# Patient Record
Sex: Female | Born: 1940
Health system: Southern US, Community
[De-identification: ages and names within clinical notes are randomized; demographics above are authoritative.]

## PROBLEM LIST (undated history)

## (undated) DIAGNOSIS — I10 Essential (primary) hypertension: Secondary | ICD-10-CM

## (undated) HISTORY — PX: TOTAL SHOULDER REPLACEMENT: SUR1217

## (undated) HISTORY — PX: VARICOSE VEIN SURGERY: SHX832

## (undated) HISTORY — PX: KNEE SURGERY: SHX244

## (undated) HISTORY — DX: Essential (primary) hypertension: I10

## (undated) HISTORY — PX: TUBAL LIGATION: SHX77

---

## 2003-12-16 ENCOUNTER — Emergency Department: Payer: Self-pay | Admitting: General Practice

## 2004-01-04 ENCOUNTER — Ambulatory Visit: Payer: Self-pay

## 2005-06-30 ENCOUNTER — Ambulatory Visit: Payer: Self-pay | Admitting: Family Medicine

## 2006-11-04 ENCOUNTER — Ambulatory Visit: Payer: Self-pay | Admitting: Family Medicine

## 2007-08-28 ENCOUNTER — Ambulatory Visit: Payer: Self-pay | Admitting: Family Medicine

## 2008-01-31 ENCOUNTER — Ambulatory Visit: Payer: Self-pay | Admitting: Family Medicine

## 2008-09-23 ENCOUNTER — Ambulatory Visit: Payer: Self-pay | Admitting: Internal Medicine

## 2009-01-07 LAB — HM DEXA SCAN: HM DEXA SCAN: NORMAL

## 2009-09-03 ENCOUNTER — Ambulatory Visit: Payer: Self-pay | Admitting: Family Medicine

## 2010-09-17 ENCOUNTER — Ambulatory Visit: Payer: Self-pay | Admitting: Internal Medicine

## 2010-09-18 LAB — HM MAMMOGRAPHY: HM Mammogram: NORMAL

## 2011-04-01 ENCOUNTER — Ambulatory Visit: Payer: Self-pay | Admitting: Internal Medicine

## 2011-10-14 ENCOUNTER — Ambulatory Visit: Payer: Self-pay | Admitting: Internal Medicine

## 2011-11-20 ENCOUNTER — Ambulatory Visit: Payer: Self-pay | Admitting: Family Medicine

## 2011-11-20 LAB — URINALYSIS, COMPLETE
Ketone: NEGATIVE
Nitrite: NEGATIVE
Ph: 6 (ref 4.5–8.0)
Protein: NEGATIVE
Specific Gravity: 1.005 (ref 1.003–1.030)

## 2011-11-20 LAB — CBC WITH DIFFERENTIAL/PLATELET
Basophil #: 0.1 10*3/uL (ref 0.0–0.1)
Basophil %: 0.6 %
Eosinophil %: 0.6 %
Lymphocyte #: 1.8 10*3/uL (ref 1.0–3.6)
Lymphocyte %: 14.9 %
MCH: 32.7 pg (ref 26.0–34.0)
MCV: 97 fL (ref 80–100)
Monocyte %: 7.3 %
Neutrophil #: 9.1 10*3/uL — ABNORMAL HIGH (ref 1.4–6.5)
RBC: 4.65 10*6/uL (ref 3.80–5.20)
RDW: 12.7 % (ref 11.5–14.5)
WBC: 11.8 10*3/uL — ABNORMAL HIGH (ref 3.6–11.0)

## 2011-11-20 LAB — BASIC METABOLIC PANEL
Anion Gap: 8 (ref 7–16)
BUN: 18 mg/dL (ref 7–18)
Calcium, Total: 9.2 mg/dL (ref 8.5–10.1)
Co2: 29 mmol/L (ref 21–32)
EGFR (African American): >60
EGFR (Non-African Amer.): 56 — ABNORMAL LOW
Glucose: 102 mg/dL — ABNORMAL HIGH (ref 65–99)
Osmolality: 281 (ref 275–301)
Sodium: 140 mmol/L (ref 136–145)

## 2011-11-20 LAB — RAPID INFLUENZA A&B ANTIGENS

## 2013-11-21 LAB — CBC AND DIFFERENTIAL: Hemoglobin: 16.6 g/dL — AB (ref 12.0–16.0)

## 2013-11-21 LAB — BASIC METABOLIC PANEL
BUN: 14 mg/dL (ref 4–21)
Creatinine: 0.8 mg/dL (ref 0.5–1.1)

## 2013-11-21 LAB — LIPID PANEL
Cholesterol: 186 mg/dL (ref 0–200)
HDL: 62 mg/dL (ref 35–70)
LDL Cholesterol: 92 mg/dL
Triglycerides: 162 mg/dL — AB (ref 40–160)

## 2013-11-21 LAB — TSH: TSH: 1.3 u[IU]/mL (ref 0.41–5.90)

## 2013-12-14 ENCOUNTER — Ambulatory Visit: Payer: Self-pay | Admitting: Internal Medicine

## 2014-08-29 DIAGNOSIS — Z0000 Encounter for general adult medical examination without abnormal findings: Secondary | ICD-10-CM | POA: Diagnosis not present

## 2014-12-01 ENCOUNTER — Encounter: Payer: Self-pay | Admitting: Internal Medicine

## 2014-12-01 ENCOUNTER — Other Ambulatory Visit: Payer: Self-pay | Admitting: Internal Medicine

## 2014-12-01 DIAGNOSIS — N3941 Urge incontinence: Secondary | ICD-10-CM | POA: Insufficient documentation

## 2014-12-01 DIAGNOSIS — I6930 Unspecified sequelae of cerebral infarction: Secondary | ICD-10-CM | POA: Insufficient documentation

## 2014-12-01 DIAGNOSIS — I10 Essential (primary) hypertension: Secondary | ICD-10-CM | POA: Insufficient documentation

## 2014-12-01 DIAGNOSIS — K219 Gastro-esophageal reflux disease without esophagitis: Secondary | ICD-10-CM | POA: Insufficient documentation

## 2014-12-01 DIAGNOSIS — Z8673 Personal history of transient ischemic attack (TIA), and cerebral infarction without residual deficits: Secondary | ICD-10-CM | POA: Insufficient documentation

## 2014-12-01 DIAGNOSIS — M19049 Primary osteoarthritis, unspecified hand: Secondary | ICD-10-CM | POA: Insufficient documentation

## 2014-12-01 DIAGNOSIS — E781 Pure hyperglyceridemia: Secondary | ICD-10-CM | POA: Insufficient documentation

## 2014-12-20 ENCOUNTER — Other Ambulatory Visit: Payer: Self-pay | Admitting: Internal Medicine

## 2014-12-21 NOTE — Telephone Encounter (Signed)
pts coming in on  2/28 for physical

## 2015-01-07 HISTORY — PX: INTRACAPSULAR CATARACT EXTRACTION: SHX361

## 2015-03-06 ENCOUNTER — Ambulatory Visit (INDEPENDENT_AMBULATORY_CARE_PROVIDER_SITE_OTHER): Payer: Medicare Other | Admitting: Internal Medicine

## 2015-03-06 ENCOUNTER — Encounter: Payer: Self-pay | Admitting: Internal Medicine

## 2015-03-06 VITALS — BP 140/84 | HR 84 | Ht 60.0 in | Wt 202.4 lb

## 2015-03-06 DIAGNOSIS — Z1211 Encounter for screening for malignant neoplasm of colon: Secondary | ICD-10-CM | POA: Diagnosis not present

## 2015-03-06 DIAGNOSIS — Z0000 Encounter for general adult medical examination without abnormal findings: Secondary | ICD-10-CM | POA: Diagnosis not present

## 2015-03-06 DIAGNOSIS — Z1231 Encounter for screening mammogram for malignant neoplasm of breast: Secondary | ICD-10-CM | POA: Diagnosis not present

## 2015-03-06 DIAGNOSIS — I10 Essential (primary) hypertension: Secondary | ICD-10-CM

## 2015-03-06 DIAGNOSIS — E781 Pure hyperglyceridemia: Secondary | ICD-10-CM

## 2015-03-06 DIAGNOSIS — I6930 Unspecified sequelae of cerebral infarction: Secondary | ICD-10-CM | POA: Diagnosis not present

## 2015-03-06 LAB — POCT URINALYSIS DIPSTICK
Bilirubin, UA: NEGATIVE
Glucose, UA: NEGATIVE
Ketones, UA: NEGATIVE
Leukocytes, UA: NEGATIVE
NITRITE UA: NEGATIVE
PH UA: 6.5
PROTEIN UA: NEGATIVE
RBC UA: NEGATIVE
Spec Grav, UA: 1.01
UROBILINOGEN UA: 0.2

## 2015-03-06 MED ORDER — METOPROLOL SUCCINATE ER 50 MG PO TB24: 50.0000 mg | ORAL_TABLET | Freq: Every day | ORAL | Status: DC

## 2015-03-06 NOTE — Progress Notes (Signed)
Patient: Maria Gibson, Female    DOB: [birth year], 75 y.o.   MRN: [id number] Visit Date: 03/06/2015  Today's Provider: Bari Edward, MD   Chief Complaint  Patient presents with  . Medicare Wellness   Subjective:    Annual wellness visit Maria Gibson is a 75 y.o. female who presents today for her Subsequent Annual Wellness Visit. She feels fairly well. She reports exercising rarely. She reports she is sleeping fairly well. She is due for a mammogram.   She recently had [facility] screening and all was normal except for BMI.  ----------------------------------------------------------- Hypertension This is a chronic problem. The current episode started more than 1 year ago. The problem is unchanged. The problem is controlled. Pertinent negatives include no chest pain, headaches, palpitations or shortness of breath.    Review of Systems  Constitutional: Negative for fever, chills and fatigue.  HENT: Negative for congestion, hearing loss, tinnitus, trouble swallowing and voice change.   Eyes: Negative for visual disturbance.  Respiratory: Negative for cough, chest tightness, shortness of breath and wheezing.   Cardiovascular: Negative for chest pain, palpitations and leg swelling.  Gastrointestinal: Negative for vomiting, abdominal pain, diarrhea and constipation.  Endocrine: Negative for polydipsia and polyuria.  Genitourinary: Negative for dysuria, frequency, vaginal bleeding, vaginal discharge and genital sores.  Musculoskeletal: Positive for arthralgias (right shoulder). Negative for joint swelling and gait problem.  Skin: Negative for color change and rash.  Neurological: Negative for dizziness, tremors, syncope, light-headedness and headaches.  Hematological: Negative for adenopathy. Does not bruise/bleed easily.  Psychiatric/Behavioral: Negative for confusion, sleep disturbance and dysphoric mood. The patient is not nervous/anxious.     Social History   Social  History  . Marital Status: Widowed    Spouse Name: N/A  . Number of Children: N/A  . Years of Education: N/A   Occupational History  . Not on file.   Social History Main Topics  . Smoking status: Never Smoker   . Smokeless tobacco: Not on file  . Alcohol Use: 0.0 oz/week    0 Standard drinks or equivalent per week     Comment: occasional  . Drug Use: No  . Sexual Activity: Not on file   Other Topics Concern  . Not on file   Social History Narrative    Patient Active Problem List   Diagnosis Date Noted  . Hx of completed stroke 12/01/2014  . Essential (primary) hypertension 12/01/2014  . Acid reflux 12/01/2014  . Hypertriglyceridemia 12/01/2014  . Degenerative arthritis of finger 12/01/2014  . Urge incontinence of urine 12/01/2014  . Sequelae of cerebral infarction 12/01/2014    Past Surgical History  Procedure Laterality Date  . Varicose vein surgery    . Tubal ligation    . Total shoulder replacement    . Knee surgery      Her family history includes Diabetes in her father; Heart failure in her father and mother.    Previous Medications   ASPIRIN 325 MG TABLET    Take 1 tablet by mouth daily.   CALCIUM CARB-CHOLECALCIFEROL (CALCIUM + D3) 600-200 MG-UNIT TABS    Take by mouth.   MULTIPLE VITAMINS PO    Take by mouth.   NUTRITIONAL SUPPLEMENTS (ARTHRO-COMPLEX PO)    Take by mouth.   PREBIOTIC PRODUCT PO    Take by mouth.    Patient Care Team: Reubin Milan, MD as PCP - General (Family Medicine)     Objective:   Vitals: BP 140/84 mmHg  Pulse  84  Ht 5' (1.524 m)  Wt 202 lb 6.4 oz (91.808 kg)  BMI 39.53 kg/m2  Physical Exam  Constitutional: She is oriented to person, place, and time. She appears well-developed and well-nourished. No distress.  HENT:  Head: Normocephalic and atraumatic.  Right Ear: Tympanic membrane and ear canal normal.  Left Ear: Tympanic membrane and ear canal normal.  Nose: Right sinus exhibits no maxillary sinus tenderness.  Left sinus exhibits no maxillary sinus tenderness.  Mouth/Throat: Uvula is midline and oropharynx is clear and moist.  Eyes: Conjunctivae and EOM are normal. Right eye exhibits no discharge. Left eye exhibits no discharge. No scleral icterus.  Neck: Normal range of motion. Carotid bruit is not present. No erythema present. No thyromegaly present.  Cardiovascular: Normal rate, regular rhythm, normal heart sounds and normal pulses.   Pulmonary/Chest: Effort normal. No respiratory distress. She has no wheezes. Right breast exhibits no mass, no nipple discharge, no skin change and no tenderness. Left breast exhibits no mass, no nipple discharge, no skin change and no tenderness.  Abdominal: Soft. Bowel sounds are normal. There is no hepatosplenomegaly. There is no tenderness. There is no CVA tenderness.  Musculoskeletal: She exhibits edema. She exhibits no tenderness.  Lymphadenopathy:    She has no cervical adenopathy.    She has no axillary adenopathy.  Neurological: She is alert and oriented to person, place, and time. She has normal reflexes. No cranial nerve deficit or sensory deficit.  Skin: Skin is warm, dry and intact. No rash noted.  Psychiatric: She has a normal mood and affect. Her speech is normal and behavior is normal. Thought content normal.  Nursing note and vitals reviewed.   Activities of Daily Living In your present state of health, do you have any difficulty performing the following activities: 03/06/2015  Hearing? N  Vision? Y  Difficulty concentrating or making decisions? N  Walking or climbing stairs? Y  Dressing or bathing? N  Doing errands, shopping? N    Fall Risk Assessment Fall Risk  03/06/2015  Falls in the past year? No      Depression Screen PHQ 2/9 Scores 03/06/2015  PHQ - 2 Score 0    Cognitive Testing - 6-CIT   Correct? Score   What year is it? yes 0 Yes = 0    No = 4  What month is it? yes 0 Yes = 0    No = 3  Remember:     [place], [place]     What time is it? yes 0 Yes = 0    No = 3  Count backwards from 20 to 1 yes 0 Correct = 0    1 error = 2   More than 1 error = 4  Say the months of the year in reverse. yes 0 Correct = 0    1 error = 2   More than 1 error = 4  What address did I ask you to remember? yes 0 Correct = 0  1 error = 2    2 error = 4    3 error = 6    4 error = 8    All wrong = 10       TOTAL SCORE  0/28   Interpretation:  Normal  Normal (0-7) Abnormal (8-28)        Medicare Annual Wellness Visit Summary:  Reviewed patient's Family Medical History Reviewed and updated list of patient's medical providers Assessment of  cognitive impairment was done Assessed patient's functional ability Established a written schedule for health screening services Health Risk Assessent Completed and Reviewed  Exercise Activities and Dietary recommendations Goals    . Increase physical activity       Immunization History  Administered Date(s) Administered  . Influenza-Unspecified 10/07/2014  . Pneumococcal Conjugate-13 11/21/2013  . Pneumococcal Polysaccharide-23 01/07/2006  . Zoster 01/08/2007    Health Maintenance  Topic Date Due  . COLONOSCOPY  12/19/1989  . TETANUS/TDAP  03/05/2020 (Originally 12/20/1958)  . INFLUENZA VACCINE  08/07/2015  . DEXA SCAN  Addressed  . ZOSTAVAX  Completed  . PNA vac Low Risk Adult  Completed     Discussed health benefits of physical activity, and encouraged her to engage in regular exercise appropriate for her age and condition.    ------------------------------------------------------------------------------------------------------------   Assessment & Plan:  1. Medicare annual wellness visit, subsequent Measures satisfied - MM DIGITAL SCREENING BILATERAL; Future  2. Essential (primary) hypertension controlled - CBC with Differential/Platelet - Comprehensive metabolic panel - TSH - POCT urinalysis dipstick - metoprolol succinate (TOPROL-XL) 50 MG  24 hr tablet; Take 1 tablet (50 mg total) by mouth daily. Take with or immediately following a meal.  Dispense: 90 tablet; Refill: 3  3. Sequelae of cerebral infarction Stable with minimal residual  4. Hypertriglyceridemia Will advise on medication Continue low fat diet and weight loss - Lipid panel  5. Colon cancer screening Pt had colonoscopy about 10 years ago - does not want to repeat - Fecal occult blood, imunochemical  6. Encounter for screening mammogram for breast cancer Pt will schedule   Bari Edward, MD [facility]  03/06/2015

## 2015-03-06 NOTE — Patient Instructions (Addendum)
Health Maintenance  Topic Date Due  . TETANUS/TDAP  12/20/1958  . COLONOSCOPY  12/19/1989  . INFLUENZA VACCINE  08/07/2015  . DEXA SCAN  Addressed  . ZOSTAVAX  Completed  . PNA vac Low Risk Adult  Completed   DASH Eating Plan DASH stands for "Dietary Approaches to Stop Hypertension." The DASH eating plan is a healthy eating plan that has been shown to reduce high blood pressure (hypertension). Additional health benefits may include reducing the risk of type 2 diabetes mellitus, heart disease, and stroke. The DASH eating plan may also help with weight loss. WHAT DO I NEED TO KNOW ABOUT THE DASH EATING PLAN? For the DASH eating plan, you will follow these general guidelines:  Choose foods with a percent daily value for sodium of less than 5% (as listed on the food label).  Use salt-free seasonings or herbs instead of table salt or sea salt.  Check with your health care provider or pharmacist before using salt substitutes.  Eat lower-sodium products, often labeled as "lower sodium" or "no salt added."  Eat fresh foods.  Eat more vegetables, fruits, and low-fat dairy products.  Choose whole grains. Look for the word "whole" as the first word in the ingredient list.  Choose fish and skinless chicken or [place] more often than red meat. Limit fish, poultry, and meat to 6 oz (170 g) each day.  Limit sweets, desserts, sugars, and sugary drinks.  Choose heart-healthy fats.  Limit cheese to 1 oz (28 g) per day.  Eat more home-cooked food and less restaurant, buffet, and fast food.  Limit fried foods.  Cook foods using methods other than frying.  Limit canned vegetables. If you do use them, rinse them well to decrease the sodium.  When eating at a restaurant, ask that your food be prepared with less salt, or no salt if possible. WHAT FOODS CAN I EAT? Seek help from a dietitian for individual calorie needs. Grains Whole grain or whole wheat bread. Brown rice. Whole grain or whole  wheat pasta. Quinoa, bulgur, and whole grain cereals. Low-sodium cereals. Corn or whole wheat flour tortillas. Whole grain cornbread. Whole grain crackers. Low-sodium crackers. Vegetables Fresh or frozen vegetables (raw, steamed, roasted, or grilled). Low-sodium or reduced-sodium tomato and vegetable juices. Low-sodium or reduced-sodium tomato sauce and paste. Low-sodium or reduced-sodium canned vegetables.  Fruits All fresh, canned (in natural juice), or frozen fruits. Meat and Other Protein Products Ground beef (85% or leaner), grass-fed beef, or beef trimmed of fat. Skinless chicken or [place]. Ground chicken or [place]. Pork trimmed of fat. All fish and seafood. Eggs. Dried beans, peas, or lentils. Unsalted nuts and seeds. Unsalted canned beans. Dairy Low-fat dairy products, such as skim or 1% milk, 2% or reduced-fat cheeses, low-fat ricotta or cottage cheese, or plain low-fat yogurt. Low-sodium or reduced-sodium cheeses. Fats and Oils Tub margarines without trans fats. Light or reduced-fat mayonnaise and salad dressings (reduced sodium). Avocado. Safflower, olive, or canola oils. Natural peanut or almond butter. Other Unsalted popcorn and pretzels. The items listed above may not be a complete list of recommended foods or beverages. Contact your dietitian for more options. WHAT FOODS ARE NOT RECOMMENDED? Grains White bread. White pasta. White rice. Refined cornbread. Bagels and croissants. Crackers that contain trans fat. Vegetables Creamed or fried vegetables. Vegetables in a cheese sauce. Regular canned vegetables. Regular canned tomato sauce and paste. Regular tomato and vegetable juices. Fruits Dried fruits. Canned fruit in light or heavy syrup. Fruit juice. Meat and Other Protein  Products Fatty cuts of meat. Ribs, chicken wings, bacon, sausage, bologna, salami, chitterlings, fatback, hot dogs, bratwurst, and packaged luncheon meats. Salted nuts and seeds. Canned beans with  salt. Dairy Whole or 2% milk, cream, half-and-half, and cream cheese. Whole-fat or sweetened yogurt. Full-fat cheeses or blue cheese. Nondairy creamers and whipped toppings. Processed cheese, cheese spreads, or cheese curds. Condiments Onion and garlic salt, seasoned salt, table salt, and sea salt. Canned and packaged gravies. Worcestershire sauce. Tartar sauce. Barbecue sauce. Teriyaki sauce. Soy sauce, including reduced sodium. Steak sauce. Fish sauce. Oyster sauce. Cocktail sauce. Horseradish. Ketchup and mustard. Meat flavorings and tenderizers. Bouillon cubes. Hot sauce. Tabasco sauce. Marinades. Taco seasonings. Relishes. Fats and Oils Butter, stick margarine, lard, shortening, ghee, and bacon fat. Coconut, palm kernel, or palm oils. Regular salad dressings. Other Pickles and olives. Salted popcorn and pretzels. The items listed above may not be a complete list of foods and beverages to avoid. Contact your dietitian for more information. WHERE CAN I FIND MORE INFORMATION? National Heart, Lung, and Blood Institute: travelstabloid.com   This information is not intended to replace advice given to you by your health care provider. Make sure you discuss any questions you have with your health care provider.   Document Released: 12/12/2010 Document Revised: 01/13/2014 Document Reviewed: 10/27/2012 Elsevier Interactive Patient Education [employer].

## 2015-03-07 ENCOUNTER — Telehealth: Payer: Self-pay

## 2015-03-07 LAB — COMPREHENSIVE METABOLIC PANEL
ALK PHOS: 73 IU/L (ref 39–117)
ALT: 23 IU/L (ref 0–32)
AST: 21 IU/L (ref 0–40)
Albumin/Globulin Ratio: 1.7 (ref 1.1–2.5)
Albumin: 4.5 g/dL (ref 3.5–4.8)
BILIRUBIN TOTAL: 0.5 mg/dL (ref 0.0–1.2)
BUN / CREAT RATIO: 19 (ref 11–26)
BUN: 16 mg/dL (ref 8–27)
CHLORIDE: 102 mmol/L (ref 96–106)
CO2: 27 mmol/L (ref 18–29)
CREATININE: 0.84 mg/dL (ref 0.57–1.00)
Calcium: 9.1 mg/dL (ref 8.7–10.3)
GFR calc Af Amer: 79 mL/min/{1.73_m2} (ref >59)
GFR calc non Af Amer: 68 mL/min/{1.73_m2} (ref >59)
GLUCOSE: 97 mg/dL (ref 65–99)
Globulin, Total: 2.6 g/dL (ref 1.5–4.5)
Potassium: 4.2 mmol/L (ref 3.5–5.2)
Sodium: 143 mmol/L (ref 134–144)
Total Protein: 7.1 g/dL (ref 6.0–8.5)

## 2015-03-07 LAB — LIPID PANEL
CHOLESTEROL TOTAL: 194 mg/dL (ref 100–199)
Chol/HDL Ratio: 2.9 ratio units (ref 0.0–4.4)
HDL: 67 mg/dL (ref >39)
LDL CALC: 96 mg/dL (ref 0–99)
TRIGLYCERIDES: 156 mg/dL — AB (ref 0–149)
VLDL CHOLESTEROL CAL: 31 mg/dL (ref 5–40)

## 2015-03-07 LAB — CBC WITH DIFFERENTIAL/PLATELET
BASOS ABS: 0 10*3/uL (ref 0.0–0.2)
Basos: 0 %
EOS (ABSOLUTE): 0.3 10*3/uL (ref 0.0–0.4)
Eos: 4 %
HEMOGLOBIN: 16 g/dL — AB (ref 11.1–15.9)
Hematocrit: 47.5 % — ABNORMAL HIGH (ref 34.0–46.6)
Immature Grans (Abs): 0 10*3/uL (ref 0.0–0.1)
Immature Granulocytes: 0 %
LYMPHS ABS: 1.7 10*3/uL (ref 0.7–3.1)
Lymphs: 28 %
MCH: 32 pg (ref 26.6–33.0)
MCHC: 33.7 g/dL (ref 31.5–35.7)
MCV: 95 fL (ref 79–97)
MONOCYTES: 11 %
Monocytes Absolute: 0.7 10*3/uL (ref 0.1–0.9)
NEUTROS ABS: 3.4 10*3/uL (ref 1.4–7.0)
Neutrophils: 57 %
PLATELETS: 245 10*3/uL (ref 150–379)
RBC: 5 x10E6/uL (ref 3.77–5.28)
RDW: 13.2 % (ref 12.3–15.4)
WBC: 6 10*3/uL (ref 3.4–10.8)

## 2015-03-07 LAB — TSH: TSH: 2.73 u[IU]/mL (ref 0.450–4.500)

## 2015-03-07 NOTE — Telephone Encounter (Signed)
Spoke with patient. Patient advised of all results and verbalized understanding. Will call back with any future questions or concerns. MA[facility]  

## 2015-03-07 NOTE — Telephone Encounter (Signed)
-----   Message from Reubin Milan, MD sent at 03/07/2015  7:55 AM EST ----- Labs are all normal.

## 2015-03-08 ENCOUNTER — Ambulatory Visit
Admission: RE | Admit: 2015-03-08 | Discharge: 2015-03-08 | Disposition: A | Discharge status: Home / Self Care | Payer: Medicare Other | Source: Ambulatory Visit | Attending: Internal Medicine | Admitting: Internal Medicine

## 2015-03-08 DIAGNOSIS — Z1231 Encounter for screening mammogram for malignant neoplasm of breast: Secondary | ICD-10-CM | POA: Insufficient documentation

## 2015-03-08 DIAGNOSIS — Z0000 Encounter for general adult medical examination without abnormal findings: Secondary | ICD-10-CM

## 2015-03-14 ENCOUNTER — Telehealth: Payer: Self-pay

## 2015-03-14 LAB — FECAL OCCULT BLOOD, IMMUNOCHEMICAL: FECAL OCCULT BLD: NEGATIVE

## 2015-03-14 NOTE — Telephone Encounter (Signed)
-----   Message from Reubin MilanLaura H Berglund, MD sent at 03/14/2015  8:06 AM EST ----- Negative.

## 2015-03-14 NOTE — Telephone Encounter (Signed)
Spoke with patient. Patient advised of all results and verbalized understanding. Will call back with any future questions or concerns. MA[facility]  

## 2015-10-17 ENCOUNTER — Encounter: Payer: Self-pay | Admitting: Internal Medicine

## 2015-10-17 ENCOUNTER — Ambulatory Visit (INDEPENDENT_AMBULATORY_CARE_PROVIDER_SITE_OTHER): Payer: Medicare Other | Admitting: Internal Medicine

## 2015-10-17 VITALS — BP 124/82 | HR 69 | Resp 16 | Ht 60.0 in | Wt 210.0 lb

## 2015-10-17 DIAGNOSIS — I10 Essential (primary) hypertension: Secondary | ICD-10-CM | POA: Diagnosis not present

## 2015-10-17 DIAGNOSIS — I739 Peripheral vascular disease, unspecified: Secondary | ICD-10-CM | POA: Insufficient documentation

## 2015-10-17 DIAGNOSIS — I872 Venous insufficiency (chronic) (peripheral): Secondary | ICD-10-CM | POA: Diagnosis not present

## 2015-10-17 DIAGNOSIS — M19072 Primary osteoarthritis, left ankle and foot: Secondary | ICD-10-CM

## 2015-10-17 DIAGNOSIS — M19079 Primary osteoarthritis, unspecified ankle and foot: Secondary | ICD-10-CM | POA: Insufficient documentation

## 2015-10-17 NOTE — Patient Instructions (Signed)
DASH Eating Plan  DASH stands for "Dietary Approaches to Stop Hypertension." The DASH eating plan is a healthy eating plan that has been shown to reduce high blood pressure (hypertension). Additional health benefits may include reducing the risk of type 2 diabetes mellitus, heart disease, and stroke. The DASH eating plan may also help with weight loss.  WHAT DO I NEED TO KNOW ABOUT THE DASH EATING PLAN?  For the DASH eating plan, you will follow these general guidelines:  · Choose foods with a percent daily value for sodium of less than 5% (as listed on the food label).  · Use salt-free seasonings or herbs instead of table salt or sea salt.  · Check with your health care provider or pharmacist before using salt substitutes.  · Eat lower-sodium products, often labeled as "lower sodium" or "no salt added."  · Eat fresh foods.  · Eat more vegetables, fruits, and low-fat dairy products.  · Choose whole grains. Look for the word "whole" as the first word in the ingredient list.  · Choose fish and skinless chicken or turkey more often than red meat. Limit fish, poultry, and meat to 6 oz (170 g) each day.  · Limit sweets, desserts, sugars, and sugary drinks.  · Choose heart-healthy fats.  · Limit cheese to 1 oz (28 g) per day.  · Eat more home-cooked food and less restaurant, buffet, and fast food.  · Limit fried foods.  · Cook foods using methods other than frying.  · Limit canned vegetables. If you do use them, rinse them well to decrease the sodium.  · When eating at a restaurant, ask that your food be prepared with less salt, or no salt if possible.  WHAT FOODS CAN I EAT?  Seek help from a dietitian for individual calorie needs.  Grains  Whole grain or whole wheat bread. Brown rice. Whole grain or whole wheat pasta. Quinoa, bulgur, and whole grain cereals. Low-sodium cereals. Corn or whole wheat flour tortillas. Whole grain cornbread. Whole grain crackers. Low-sodium crackers.  Vegetables  Fresh or frozen vegetables  (raw, steamed, roasted, or grilled). Low-sodium or reduced-sodium tomato and vegetable juices. Low-sodium or reduced-sodium tomato sauce and paste. Low-sodium or reduced-sodium canned vegetables.   Fruits  All fresh, canned (in natural juice), or frozen fruits.  Meat and Other Protein Products  Ground beef (85% or leaner), grass-fed beef, or beef trimmed of fat. Skinless chicken or turkey. Ground chicken or turkey. Pork trimmed of fat. All fish and seafood. Eggs. Dried beans, peas, or lentils. Unsalted nuts and seeds. Unsalted canned beans.  Dairy  Low-fat dairy products, such as skim or 1% milk, 2% or reduced-fat cheeses, low-fat ricotta or cottage cheese, or plain low-fat yogurt. Low-sodium or reduced-sodium cheeses.  Fats and Oils  Tub margarines without trans fats. Light or reduced-fat mayonnaise and salad dressings (reduced sodium). Avocado. Safflower, olive, or canola oils. Natural peanut or almond butter.  Other  Unsalted popcorn and pretzels.  The items listed above may not be a complete list of recommended foods or beverages. Contact your dietitian for more options.  WHAT FOODS ARE NOT RECOMMENDED?  Grains  White bread. White pasta. White rice. Refined cornbread. Bagels and croissants. Crackers that contain trans fat.  Vegetables  Creamed or fried vegetables. Vegetables in a cheese sauce. Regular canned vegetables. Regular canned tomato sauce and paste. Regular tomato and vegetable juices.  Fruits  Dried fruits. Canned fruit in light or heavy syrup. Fruit juice.  Meat and Other Protein   Products  Fatty cuts of meat. Ribs, chicken wings, bacon, sausage, bologna, salami, chitterlings, fatback, hot dogs, bratwurst, and packaged luncheon meats. Salted nuts and seeds. Canned beans with salt.  Dairy  Whole or 2% milk, cream, half-and-half, and cream cheese. Whole-fat or sweetened yogurt. Full-fat cheeses or blue cheese. Nondairy creamers and whipped toppings. Processed cheese, cheese spreads, or cheese  curds.  Condiments  Onion and garlic salt, seasoned salt, table salt, and sea salt. Canned and packaged gravies. Worcestershire sauce. Tartar sauce. Barbecue sauce. Teriyaki sauce. Soy sauce, including reduced sodium. Steak sauce. Fish sauce. Oyster sauce. Cocktail sauce. Horseradish. Ketchup and mustard. Meat flavorings and tenderizers. Bouillon cubes. Hot sauce. Tabasco sauce. Marinades. Taco seasonings. Relishes.  Fats and Oils  Butter, stick margarine, lard, shortening, ghee, and bacon fat. Coconut, palm kernel, or palm oils. Regular salad dressings.  Other  Pickles and olives. Salted popcorn and pretzels.  The items listed above may not be a complete list of foods and beverages to avoid. Contact your dietitian for more information.  WHERE CAN I FIND MORE INFORMATION?  National Heart, Lung, and Blood Institute: www.nhlbi.nih.gov/health/health-topics/topics/dash/     This information is not intended to replace advice given to you by your health care provider. Make sure you discuss any questions you have with your health care provider.     Document Released: 12/12/2010 Document Revised: 01/13/2014 Document Reviewed: 10/27/2012  Elsevier Interactive Patient Education ©2016 Elsevier Inc.

## 2015-10-17 NOTE — Progress Notes (Signed)
Date:  10/17/2015   Name:  Maria FreezeCarol Sears Gibson   DOB:  [birth year]   MRN:  [id number]   Chief Complaint: PAD [facility](HH Nurse did screening and said L leg below avg R over avg PAD may be to blame according [facility] ) [facility]  Used a machine attached to her great toe to measure arterial flow.  She was told it was decreased on the left but normal on the right and was told this could cause the ankle pain and swelling. She has edema of both lower legs as well as left ankle pain.  Seen by Ortho - joint aspiration of left ankle negative for crystals.  She takes no medication for ankle pain. She has tried compression stocks for edema.  She is very sedentary.  She does try to limit her salt intake.   Review of Systems  Constitutional: Negative for chills, fatigue and fever.  Respiratory: Negative for cough, shortness of breath and wheezing.   Cardiovascular: Positive for leg swelling. Negative for chest pain and palpitations.  Gastrointestinal: Negative for abdominal pain.  Endocrine: Negative for polydipsia and polyuria.  Musculoskeletal: Positive for arthralgias (left ankle) and joint swelling. Negative for gait problem.  Skin: Negative for color change and wound.  Psychiatric/Behavioral: Negative for dysphoric mood. The patient is not nervous/anxious.     Patient Active Problem List   Diagnosis Date Noted  . Venous insufficiency of both lower extremities 10/17/2015  . OA (osteoarthritis) of ankle 10/17/2015  . Hx of completed stroke 12/01/2014  . Essential (primary) hypertension 12/01/2014  . Acid reflux 12/01/2014  . Hypertriglyceridemia 12/01/2014  . Degenerative arthritis of finger 12/01/2014  . Urge incontinence of urine 12/01/2014  . Sequelae of cerebral infarction 12/01/2014    Prior to Admission medications   Medication Sig Start Date End Date Taking? Authorizing Provider  aspirin 325 MG tablet Take 1 tablet by mouth daily.    Historical Provider, MD    Calcium Carb-Cholecalciferol (CALCIUM + D3) 600-200 MG-UNIT TABS Take by mouth.    Historical Provider, MD  metoprolol succinate (TOPROL-XL) 50 MG 24 hr tablet Take 1 tablet (50 mg total) by mouth daily. Take with or immediately following a meal. 03/06/15   Reubin MilanLaura H Reather Steller, MD  MULTIPLE VITAMINS PO Take by mouth.    Historical Provider, MD  Nutritional Supplements (ARTHRO-COMPLEX PO) Take by mouth.    Historical Provider, MD  PREBIOTIC PRODUCT PO Take by mouth.    Historical Provider, MD    Allergies  Allergen Reactions  . Codeine   . Morphine Sulfate   . Shellfish Allergy   . Sulfa Antibiotics Nausea And Vomiting    Past Surgical History:  Procedure Laterality Date  . KNEE SURGERY    . TOTAL SHOULDER REPLACEMENT    . TUBAL LIGATION    . VARICOSE VEIN SURGERY      Social History  Substance Use Topics  . Smoking status: Never Smoker  . Smokeless tobacco: Never Used  . Alcohol use 0.0 oz/week     Comment: occasional     Medication list has been reviewed and updated.   Physical Exam  Constitutional: She is oriented to person, place, and time. She appears well-developed. No distress.  HENT:  Head: Normocephalic and atraumatic.  Cardiovascular: Normal rate, regular rhythm and normal heart sounds.   Pulses:      Dorsalis pedis pulses are 2+ on the right side, and 1+ on the left side.  Posterior tibial pulses are 1+ on the right side, and 1+ on the left side.  Pulmonary/Chest: Effort normal and breath sounds normal. No respiratory distress.  Musculoskeletal: She exhibits edema (2+ edema to mid tibia bilaterally) and tenderness.       Left ankle: She exhibits decreased range of motion. Tenderness.  Neurological: She is alert and oriented to person, place, and time.  Skin: Skin is warm and dry. No rash noted.  Punctate dusky red lesions on left lower leg and dorsum of foot  Psychiatric: She has a normal mood and affect. Her behavior is normal. Thought content normal.   Nursing note and vitals reviewed.   BP 124/82   Pulse 69   Resp 16   Ht 5' (1.524 m)   Wt 210 lb (95.3 kg)   SpO2 96%   BMI 41.01 kg/m   Assessment and Plan: 1. Essential (primary) hypertension controlled  2. Venous insufficiency of both lower extremities Recommend walking, compression socks Sodium restriction No evidence of significant or sx PAD - will review records from Nurse home visit when received  3. Primary osteoarthritis of left ankle Take Tylenol 1-2 times per day as needed   Bari Edward, MD [facility]  10/17/2015

## 2016-02-14 ENCOUNTER — Other Ambulatory Visit: Payer: Self-pay | Admitting: Internal Medicine

## 2016-02-14 DIAGNOSIS — Z1231 Encounter for screening mammogram for malignant neoplasm of breast: Secondary | ICD-10-CM

## 2016-02-26 ENCOUNTER — Other Ambulatory Visit: Payer: Self-pay | Admitting: Family Medicine

## 2016-03-11 ENCOUNTER — Ambulatory Visit
Admission: RE | Admit: 2016-03-11 | Discharge: 2016-03-11 | Disposition: A | Discharge status: Home / Self Care | Payer: Medicare Other | Source: Ambulatory Visit | Attending: Internal Medicine | Admitting: Internal Medicine

## 2016-03-11 DIAGNOSIS — Z1231 Encounter for screening mammogram for malignant neoplasm of breast: Secondary | ICD-10-CM | POA: Diagnosis not present

## 2016-03-15 ENCOUNTER — Other Ambulatory Visit: Payer: Self-pay | Admitting: Internal Medicine

## 2016-03-15 DIAGNOSIS — I10 Essential (primary) hypertension: Secondary | ICD-10-CM

## 2016-03-18 NOTE — Telephone Encounter (Signed)
Number on file is disconnected unable to reach pt

## 2016-03-18 NOTE — Telephone Encounter (Signed)
Try calling her significant other - Farooq Al Mafrachi.

## 2016-03-19 NOTE — Telephone Encounter (Signed)
The point was just to call him and ask to speak to [place].  There is no HIPPA violation in that.

## 2016-03-19 NOTE — Telephone Encounter (Signed)
Patient hasn't given me permission to talk with her significant other, also not listed on her emergency contact.

## 2016-03-19 NOTE — Telephone Encounter (Signed)
Pt no longer lives with her significant, unable to get a hold of patient

## 2016-04-01 ENCOUNTER — Encounter: Payer: Self-pay | Admitting: Internal Medicine

## 2016-04-01 ENCOUNTER — Ambulatory Visit (INDEPENDENT_AMBULATORY_CARE_PROVIDER_SITE_OTHER): Payer: Medicare Other | Admitting: Internal Medicine

## 2016-04-01 VITALS — BP 132/82 | HR 82 | Ht 60.0 in | Wt 195.0 lb

## 2016-04-01 DIAGNOSIS — I10 Essential (primary) hypertension: Secondary | ICD-10-CM

## 2016-04-01 DIAGNOSIS — M19072 Primary osteoarthritis, left ankle and foot: Secondary | ICD-10-CM | POA: Diagnosis not present

## 2016-04-01 DIAGNOSIS — E781 Pure hyperglyceridemia: Secondary | ICD-10-CM | POA: Diagnosis not present

## 2016-04-01 MED ORDER — METOPROLOL SUCCINATE ER 50 MG PO TB24: 50.0000 mg | ORAL_TABLET | Freq: Every day | ORAL | 1 refills | Status: DC

## 2016-04-01 NOTE — Progress Notes (Signed)
Date:  04/01/2016   Name:  Maria FreezeCarol Sears Mantz   DOB:  07-Nov-1940   MRN:  [id number]   Chief Complaint: Hypertension Hypertension  This is a chronic problem. The problem is unchanged. The problem is controlled. Pertinent negatives include no chest pain, headaches, palpitations or shortness of breath. Past treatments include beta blockers. The current treatment provides significant improvement.   OA Ankle - seen by Ortho.  Uses a cane for ambulation.  Taking only tylenol as needed for pain.  She also takes an aspirin per day.     Review of Systems  Constitutional: Negative for appetite change, fatigue, fever and unexpected weight change.  HENT: Negative for tinnitus and trouble swallowing.   Eyes: Negative for visual disturbance.  Respiratory: Negative for cough, chest tightness and shortness of breath.   Cardiovascular: Negative for chest pain, palpitations and leg swelling.  Gastrointestinal: Negative for abdominal pain.  Endocrine: Negative for polydipsia and polyuria.  Genitourinary: Negative for dysuria and hematuria.  Musculoskeletal: Negative for arthralgias.  Neurological: Negative for tremors, numbness and headaches.  Psychiatric/Behavioral: Negative for dysphoric mood.    Patient Active Problem List   Diagnosis Date Noted  . Venous insufficiency of both lower extremities 10/17/2015  . OA (osteoarthritis) of ankle 10/17/2015  . Hx of completed stroke 12/01/2014  . Essential (primary) hypertension 12/01/2014  . Acid reflux 12/01/2014  . Hypertriglyceridemia 12/01/2014  . Degenerative arthritis of finger 12/01/2014  . Urge incontinence of urine 12/01/2014  . Sequelae of cerebral infarction 12/01/2014    Prior to Admission medications   Medication Sig Start Date End Date Taking? Authorizing Provider  aspirin 325 MG tablet Take 1 tablet by mouth daily.    Historical Provider, MD  Calcium Carb-Cholecalciferol (CALCIUM + D3) 600-200 MG-UNIT TABS Take by mouth.     Historical Provider, MD  metoprolol succinate (TOPROL-XL) 50 MG 24 hr tablet TAKE 1 TABLET BY MOUTH DAILY WITH OR IMMEDIATELY FOLLOWING A MEAL 03/16/16   Reubin MilanLaura H Doral Digangi, MD  Multiple Vitamins-Minerals (MULTIVITAMIN ADULTS 50+ PO) Take by mouth.    Historical Provider, MD    Allergies  Allergen Reactions  . Codeine   . Morphine Sulfate   . Shellfish Allergy   . Sulfa Antibiotics Nausea And Vomiting    Past Surgical History:  Procedure Laterality Date  . KNEE SURGERY    . TOTAL SHOULDER REPLACEMENT    . TUBAL LIGATION    . VARICOSE VEIN SURGERY      Social History  Substance Use Topics  . Smoking status: Never Smoker  . Smokeless tobacco: Never Used  . Alcohol use 0.0 oz/week     Comment: occasional     Medication list has been reviewed and updated.   Physical Exam  Constitutional: She is oriented to person, place, and time. She appears well-developed. No distress.  HENT:  Head: Normocephalic and atraumatic.  Neck: Carotid bruit is not present.  Cardiovascular: Normal rate, regular rhythm and normal heart sounds.   Pulmonary/Chest: Effort normal and breath sounds normal. No respiratory distress. She has no wheezes. She has no rhonchi.  Musculoskeletal:       Left knee: She exhibits decreased range of motion (post surgical changes).       Left ankle: Normal.  Neurological: She is alert and oriented to person, place, and time.  Skin: Skin is warm and dry. No rash noted.  Psychiatric: She has a normal mood and affect. Her behavior is normal. Thought content normal.  Nursing note and  vitals reviewed.   BP 132/82 (BP Location: Right Arm, Patient Position: Sitting, Cuff Size: Large)   Pulse 82   Ht 5' (1.524 m)   Wt 195 lb (88.5 kg)   SpO2 99%   BMI 38.08 kg/m   Assessment and Plan: 1. Essential (primary) hypertension controlled - CBC with Differential/Platelet - Comprehensive metabolic panel - metoprolol succinate (TOPROL-XL) 50 MG 24 hr tablet; Take 1 tablet  (50 mg total) by mouth daily. Take with or immediately following a meal.  Dispense: 90 tablet; Refill: 1  2. Primary osteoarthritis of left ankle Seeing Ortho next week to see if ankle and knee replacement status is the culprit  3. Hypertriglyceridemia Continue healthy diet Has lost 15 lbs on [employer] - TSH - Lipid panel   Meds ordered this encounter  Medications  . metoprolol succinate (TOPROL-XL) 50 MG 24 hr tablet    Sig: Take 1 tablet (50 mg total) by mouth daily. Take with or immediately following a meal.    Dispense:  90 tablet    Refill:  1    Bari Edward, MD [facility]  04/01/2016

## 2016-04-02 LAB — COMPREHENSIVE METABOLIC PANEL
ALBUMIN: 4.5 g/dL (ref 3.5–4.8)
ALK PHOS: 69 IU/L (ref 39–117)
ALT: 33 IU/L — ABNORMAL HIGH (ref 0–32)
AST: 24 IU/L (ref 0–40)
Albumin/Globulin Ratio: 1.6 (ref 1.2–2.2)
BUN/Creatinine Ratio: 18 (ref 12–28)
BUN: 17 mg/dL (ref 8–27)
Bilirubin Total: 0.6 mg/dL (ref 0.0–1.2)
CO2: 28 mmol/L (ref 18–29)
CREATININE: 0.97 mg/dL (ref 0.57–1.00)
Calcium: 9.4 mg/dL (ref 8.7–10.3)
Chloride: 98 mmol/L (ref 96–106)
GFR calc Af Amer: 66 mL/min/{1.73_m2} (ref >59)
GFR calc non Af Amer: 57 mL/min/{1.73_m2} — ABNORMAL LOW (ref >59)
GLUCOSE: 88 mg/dL (ref 65–99)
Globulin, Total: 2.9 g/dL (ref 1.5–4.5)
Potassium: 4.4 mmol/L (ref 3.5–5.2)
Sodium: 143 mmol/L (ref 134–144)
Total Protein: 7.4 g/dL (ref 6.0–8.5)

## 2016-04-02 LAB — CBC WITH DIFFERENTIAL/PLATELET
BASOS: 0 %
Basophils Absolute: 0 10*3/uL (ref 0.0–0.2)
EOS (ABSOLUTE): 0.2 10*3/uL (ref 0.0–0.4)
Eos: 3 %
HEMOGLOBIN: 16.7 g/dL — AB (ref 11.1–15.9)
Hematocrit: 49.6 % — ABNORMAL HIGH (ref 34.0–46.6)
IMMATURE GRANULOCYTES: 0 %
Immature Grans (Abs): 0 10*3/uL (ref 0.0–0.1)
LYMPHS ABS: 2.4 10*3/uL (ref 0.7–3.1)
Lymphs: 37 %
MCH: 32.5 pg (ref 26.6–33.0)
MCHC: 33.7 g/dL (ref 31.5–35.7)
MCV: 97 fL (ref 79–97)
MONOS ABS: 0.5 10*3/uL (ref 0.1–0.9)
Monocytes: 8 %
NEUTROS ABS: 3.3 10*3/uL (ref 1.4–7.0)
Neutrophils: 52 %
Platelets: 213 10*3/uL (ref 150–379)
RBC: 5.14 x10E6/uL (ref 3.77–5.28)
RDW: 13.4 % (ref 12.3–15.4)
WBC: 6.5 10*3/uL (ref 3.4–10.8)

## 2016-04-02 LAB — LIPID PANEL
CHOL/HDL RATIO: 2.7 ratio (ref 0.0–4.4)
Cholesterol, Total: 184 mg/dL (ref 100–199)
HDL: 68 mg/dL (ref >39)
LDL Calculated: 86 mg/dL (ref 0–99)
Triglycerides: 149 mg/dL (ref 0–149)
VLDL CHOLESTEROL CAL: 30 mg/dL (ref 5–40)

## 2016-04-02 LAB — TSH: TSH: 2.69 u[IU]/mL (ref 0.450–4.500)

## 2016-04-15 ENCOUNTER — Other Ambulatory Visit: Payer: Self-pay | Admitting: Internal Medicine

## 2016-04-15 ENCOUNTER — Encounter: Payer: Self-pay | Admitting: Internal Medicine

## 2016-04-15 ENCOUNTER — Ambulatory Visit (INDEPENDENT_AMBULATORY_CARE_PROVIDER_SITE_OTHER): Payer: Medicare Other | Admitting: Internal Medicine

## 2016-04-15 VITALS — BP 112/74 | HR 76 | Ht 60.0 in | Wt 191.8 lb

## 2016-04-15 DIAGNOSIS — H02402 Unspecified ptosis of left eyelid: Secondary | ICD-10-CM | POA: Diagnosis not present

## 2016-04-15 DIAGNOSIS — K219 Gastro-esophageal reflux disease without esophagitis: Secondary | ICD-10-CM | POA: Diagnosis not present

## 2016-04-15 DIAGNOSIS — M1711 Unilateral primary osteoarthritis, right knee: Secondary | ICD-10-CM

## 2016-04-15 DIAGNOSIS — Z8673 Personal history of transient ischemic attack (TIA), and cerebral infarction without residual deficits: Secondary | ICD-10-CM

## 2016-04-15 DIAGNOSIS — I10 Essential (primary) hypertension: Secondary | ICD-10-CM | POA: Diagnosis not present

## 2016-04-15 NOTE — Progress Notes (Signed)
Date:  04/15/2016   Name:  Maria Gibson   DOB:  12/11/1940   MRN:  [id number]   Chief Complaint: GI Problem ([facility] states symptoms are subsiding since hospital visit. Tested gakll bladder and found out it was sludge. They don't want remove it unless pain comes back quite often. ) GI Problem  The primary symptoms include abdominal pain and nausea. Primary symptoms do not include fever, fatigue or vomiting. The illness began more than 7 days ago. Onset quality: since starting omeprazole. The problem has been rapidly improving.  The illness does not include chills.   Hx CVA - affecting right eye.  Has double vision since.  Now having some eye lid droop over left eye.  She is concerned that it is a sequelae of stroke.   Review of Systems  Constitutional: Negative for chills, fatigue and fever.  Eyes: Positive for visual disturbance (double vision).  Respiratory: Negative for chest tightness, shortness of breath and wheezing.   Cardiovascular: Negative for chest pain and palpitations.  Gastrointestinal: Positive for abdominal pain and nausea. Negative for blood in stool and vomiting.    Patient Active Problem List   Diagnosis Date Noted  . Venous insufficiency of both lower extremities 10/17/2015  . OA (osteoarthritis) of ankle 10/17/2015  . Hx of completed stroke 12/01/2014  . Essential (primary) hypertension 12/01/2014  . Acid reflux 12/01/2014  . Hypertriglyceridemia 12/01/2014  . Degenerative arthritis of finger 12/01/2014  . Urge incontinence of urine 12/01/2014  . Sequelae of cerebral infarction 12/01/2014    Prior to Admission medications   Medication Sig Start Date End Date Taking? Authorizing Provider  aspirin 325 MG tablet Take 1 tablet by mouth daily.   Yes Historical Provider, MD  Calcium Carb-Cholecalciferol (CALCIUM + D3) 600-200 MG-UNIT TABS Take by mouth.   Yes Historical Provider, MD  metoprolol succinate  (TOPROL-XL) 50 MG 24 hr tablet Take 1 tablet (50 mg total) by mouth daily. Take with or immediately following a meal. 04/01/16  Yes Reubin Milan, MD  Multiple Vitamins-Minerals (MULTIVITAMIN ADULTS 50+ PO) Take by mouth.   Yes Historical Provider, MD  omeprazole (PRILOSEC) 20 MG capsule Take 1 capsule by mouth daily. 04/13/16  Yes Historical Provider, MD    Allergies  Allergen Reactions  . Codeine   . Morphine Sulfate   . Shellfish Allergy   . Sulfa Antibiotics Nausea And Vomiting    Past Surgical History:  Procedure Laterality Date  . KNEE SURGERY    . TOTAL SHOULDER REPLACEMENT    . TUBAL LIGATION    . VARICOSE VEIN SURGERY      Social History  Substance Use Topics  . Smoking status: Never Smoker  . Smokeless tobacco: Never Used  . Alcohol use 0.0 oz/week     Comment: occasional     Medication list has been reviewed and updated.   Physical Exam  Constitutional: She is oriented to person, place, and time. She appears well-developed. No distress.  HENT:  Head: Normocephalic and atraumatic.  Eyes: Conjunctivae are normal. Right eye exhibits normal extraocular motion. Left eye exhibits abnormal extraocular motion (slightly lateral gaze noted).    Neck: Normal range of motion. No thyromegaly present.  Cardiovascular: Normal rate, regular rhythm and normal heart sounds.   Pulmonary/Chest: Effort normal and breath sounds normal. No respiratory distress. She has no wheezes.  Abdominal: Soft. Bowel sounds are normal. There is no tenderness.  Musculoskeletal: She exhibits no  edema.  Neurological: She is alert and oriented to person, place, and time.  Skin: Skin is warm and dry. No rash noted.  Psychiatric: She has a normal mood and affect. Her behavior is normal. Thought content normal.  Nursing note and vitals reviewed.   BP 112/74 (BP Location: Right Arm, Patient Position: Sitting, Cuff Size: Large)   Pulse 76   Ht 5' (1.524 m)   Wt 191 lb 12.8 oz (87 kg)   SpO2 98%    BMI 37.46 kg/m   Assessment and Plan: 1. Gastroesophageal reflux disease without esophagitis Continue daily PPI  2. Essential (primary) hypertension controlled  3. Arthritis of right knee Pursuing surgery  4. Hx of completed stroke With apparent left 3rd nerve palsy Eye lid droop appears to be due to redundant skin rather than recurrent stroke - consider surgical consultation  No orders of the defined types were placed in this encounter.   Bari Edward, MD [facility]  04/15/2016

## 2016-06-16 ENCOUNTER — Ambulatory Visit: Payer: Medicare Other

## 2016-06-16 ENCOUNTER — Encounter: Payer: Self-pay | Admitting: Internal Medicine

## 2016-06-16 ENCOUNTER — Telehealth: Payer: Self-pay | Admitting: Internal Medicine

## 2016-06-16 ENCOUNTER — Ambulatory Visit (INDEPENDENT_AMBULATORY_CARE_PROVIDER_SITE_OTHER): Payer: Medicare Other

## 2016-06-16 VITALS — BP 112/68 | HR 72 | Temp 98.0°F | Resp 16 | Ht 60.0 in | Wt 188.6 lb

## 2016-06-16 DIAGNOSIS — Z0000 Encounter for general adult medical examination without abnormal findings: Secondary | ICD-10-CM

## 2016-06-16 NOTE — Patient Instructions (Addendum)
Maria Gibson , Thank you for taking time to come for your Medicare Wellness Visit. I appreciate your ongoing commitment to your health goals. Please review the following plan we discussed and let me know if I can assist you in the future.   Screening recommendations/referrals: Colonoscopy: no longer required Mammogram: completed 03/31/2016, no longer required Bone Density: Due now Recommended yearly ophthalmology/optometry visit for glaucoma screening and checkup Recommended yearly dental visit for hygiene and checkup  Vaccinations: Influenza vaccine: up to date, due 09/2016  Pneumococcal vaccine: completed series Tdap vaccine: check with your insurance company for coverage  Shingles vaccine: shingrix- 04/01/2016    Advanced directives: Please bring a copy of your health care power of attorney and living will at your convenience.   Conditions/risks identified: Recommend drinking at least 3-4 glasses of water a day  Next appointment: Follow up in one year for your annual wellness exam.    Preventive Care 65 Years and Older, Female Preventive care refers to lifestyle choices and visits with your health care provider that can promote health and wellness. What does preventive care include?  A yearly physical exam. This is also called an annual well check.  Dental exams once or twice a year.  Routine eye exams. Ask your health care provider how often you should have your eyes checked.  Personal lifestyle choices, including:  Daily care of your teeth and gums.  Regular physical activity.  Eating a healthy diet.  Avoiding tobacco and drug use.  Limiting alcohol use.  Practicing safe sex.  Taking low-dose aspirin every day.  Taking vitamin and mineral supplements as recommended by your health care provider. What happens during an annual well check? The services and screenings done by your health care provider during your annual well check will depend on your age, overall  health, lifestyle risk factors, and family history of disease. Counseling  Your health care provider may ask you questions about your:  Alcohol use.  Tobacco use.  Drug use.  Emotional well-being.  Home and relationship well-being.  Sexual activity.  Eating habits.  History of falls.  Memory and ability to understand (cognition).  Work and work Astronomerenvironment.  Reproductive health. Screening  You may have the following tests or measurements:  Height, weight, and BMI.  Blood pressure.  Lipid and cholesterol levels. These may be checked every 5 years, or more frequently if you are over 77 years old.  Skin check.  Lung cancer screening. You may have this screening every year starting at age 77 if you have a 30-pack-year history of smoking and currently smoke or have quit within the past 15 years.  Fecal occult blood test (FOBT) of the stool. You may have this test every year starting at age 77.  Flexible sigmoidoscopy or colonoscopy. You may have a sigmoidoscopy every 5 years or a colonoscopy every 10 years starting at age 77.  Hepatitis C blood test.  Hepatitis B blood test.  Sexually transmitted disease (STD) testing.  Diabetes screening. This is done by checking your blood sugar (glucose) after you have not eaten for a while (fasting). You may have this done every 1-3 years.  Bone density scan. This is done to screen for osteoporosis. You may have this done starting at age 77.  Mammogram. This may be done every 1-2 years. Talk to your health care provider about how often you should have regular mammograms. Talk with your health care provider about your test results, treatment options, and if necessary, the need for  more tests. Vaccines  Your health care provider may recommend certain vaccines, such as:  Influenza vaccine. This is recommended every year.  Tetanus, diphtheria, and acellular pertussis (Tdap, Td) vaccine. You may need a Td booster every 10  years.  Zoster vaccine. You may need this after age 34.  Pneumococcal 13-valent conjugate (PCV13) vaccine. One dose is recommended after age 1.  Pneumococcal polysaccharide (PPSV23) vaccine. One dose is recommended after age 11. Talk to your health care provider about which screenings and vaccines you need and how often you need them. This information is not intended to replace advice given to you by your health care provider. Make sure you discuss any questions you have with your health care provider. Document Released: 01/19/2015 Document Revised: 09/12/2015 Document Reviewed: 10/24/2014 Elsevier Interactive Patient Education  2017 [employer] Prevention in the Home Falls can cause injuries. They can happen to people of all ages. There are many things you can do to make your home safe and to help prevent falls. What can I do on the outside of my home?  Regularly fix the edges of walkways and driveways and fix any cracks.  Remove anything that might make you trip as you walk through a door, such as a raised step or threshold.  Trim any bushes or trees on the path to your home.  Use bright outdoor lighting.  Clear any walking paths of anything that might make someone trip, such as rocks or tools.  Regularly check to see if handrails are loose or broken. Make sure that both sides of any steps have handrails.  Any raised decks and porches should have guardrails on the edges.  Have any leaves, snow, or ice cleared regularly.  Use sand or salt on walking paths during winter.  Clean up any spills in your garage right away. This includes oil or grease spills. What can I do in the bathroom?  Use night lights.  Install grab bars by the toilet and in the tub and shower. Do not use towel bars as grab bars.  Use non-skid mats or decals in the tub or shower.  If you need to sit down in the shower, use a plastic, non-slip stool.  Keep the floor dry. Clean up any water that  spills on the floor as soon as it happens.  Remove soap buildup in the tub or shower regularly.  Attach bath mats securely with double-sided non-slip rug tape.  Do not have throw rugs and other things on the floor that can make you trip. What can I do in the bedroom?  Use night lights.  Make sure that you have a light by your bed that is easy to reach.  Do not use any sheets or blankets that are too big for your bed. They should not hang down onto the floor.  Have a firm chair that has side arms. You can use this for support while you get dressed.  Do not have throw rugs and other things on the floor that can make you trip. What can I do in the kitchen?  Clean up any spills right away.  Avoid walking on wet floors.  Keep items that you use a lot in easy-to-reach places.  If you need to reach something above you, use a strong step stool that has a grab bar.  Keep electrical cords out of the way.  Do not use floor polish or wax that makes floors slippery. If you must use wax, use non-skid  floor wax.  Do not have throw rugs and other things on the floor that can make you trip. What can I do with my stairs?  Do not leave any items on the stairs.  Make sure that there are handrails on both sides of the stairs and use them. Fix handrails that are broken or loose. Make sure that handrails are as long as the stairways.  Check any carpeting to make sure that it is firmly attached to the stairs. Fix any carpet that is loose or worn.  Avoid having throw rugs at the top or bottom of the stairs. If you do have throw rugs, attach them to the floor with carpet tape.  Make sure that you have a light switch at the top of the stairs and the bottom of the stairs. If you do not have them, ask someone to add them for you. What else can I do to help prevent falls?  Wear shoes that:  Do not have high heels.  Have rubber bottoms.  Are comfortable and fit you well.  Are closed at the  toe. Do not wear sandals.  If you use a stepladder:  Make sure that it is fully opened. Do not climb a closed stepladder.  Make sure that both sides of the stepladder are locked into place.  Ask someone to hold it for you, if possible.  Clearly mark and make sure that you can see:  Any grab bars or handrails.  First and last steps.  Where the edge of each step is.  Use tools that help you move around (mobility aids) if they are needed. These include:  Canes.  Walkers.  Scooters.  Crutches.  Turn on the lights when you go into a dark area. Replace any light bulbs as soon as they burn out.  Set up your furniture so you have a clear path. Avoid moving your furniture around.  If any of your floors are uneven, fix them.  If there are any pets around you, be aware of where they are.  Review your medicines with your doctor. Some medicines can make you feel dizzy. This can increase your chance of falling. Ask your doctor what other things that you can do to help prevent falls. This information is not intended to replace advice given to you by your health care provider. Make sure you discuss any questions you have with your health care provider. Document Released: 10/19/2008 Document Revised: 05/31/2015 Document Reviewed: 01/27/2014 Elsevier Interactive Patient Education  2017 [employer].

## 2016-06-16 NOTE — Progress Notes (Signed)
Subjective:   Maria Gibson is a 76 y.o. female who presents for Medicare Annual (Subsequent) preventive examination.  Review of Systems:   Cardiac Risk Factors include: hypertension;obesity (BMI >30kg/m2);advanced age (>7055men, 13>65 women)     Objective:     Vitals: BP 112/68 (BP Location: Right Arm, Patient Position: Sitting)   Pulse 72   Temp 98 F (36.7 C)   Resp 16   Ht 5' (1.524 m)   Wt 188 lb 9.6 oz (85.5 kg)   BMI 36.83 kg/m   Body mass index is 36.83 kg/m.   Tobacco History  Smoking Status  . Never Smoker  Smokeless Tobacco  . Never Used     Counseling given: Not Answered   Past Medical History:  Diagnosis Date  . Hypertension    Past Surgical History:  Procedure Laterality Date  . INTRACAPSULAR CATARACT EXTRACTION Bilateral 2017  . KNEE SURGERY    . TOTAL SHOULDER REPLACEMENT Left   . TUBAL LIGATION    . VARICOSE VEIN SURGERY     Family History  Problem Relation Age of Onset  . Heart failure Mother   . Diabetes Father   . Heart failure Father   . Breast cancer Neg Hx    History  Sexual Activity  . Sexual activity: Not on file    Outpatient Encounter Prescriptions as of 06/16/2016  Medication Sig  . aspirin 325 MG tablet Take 1 tablet by mouth daily.  . Calcium Carb-Cholecalciferol (CALCIUM + D3) 600-200 MG-UNIT TABS Take by mouth.  . metoprolol succinate (TOPROL-XL) 50 MG 24 hr tablet Take 1 tablet (50 mg total) by mouth daily. Take with or immediately following a meal.  . Multiple Vitamins-Minerals (MULTIVITAMIN ADULTS 50+ PO) Take by mouth.  Marland Kitchen. omeprazole (PRILOSEC) 20 MG capsule Take 1 capsule by mouth daily.   No facility-administered encounter medications on file as of 06/16/2016.     Activities of Daily Living In your present state of health, do you have any difficulty performing the following activities: 06/16/2016 04/01/2016  Hearing? N N  Vision? N N  Difficulty concentrating or making decisions? N N  Walking or climbing  stairs? Y N  Dressing or bathing? N N  Doing errands, shopping? Y N  Preparing Food and eating ? N N  Using the Toilet? N N  In the past six months, have you accidently leaked urine? Y N  Do you have problems with loss of bowel control? N N  Managing your Medications? N N  Managing your Finances? N N  Housekeeping or managing your Housekeeping? N N  Some recent data might be hidden    Patient Care Team: Reubin MilanBerglund, Laura H, MD as PCP - General (Family Medicine) Zenon MayoMerz, Michael, MD as Referring Physician (Orthopedic Surgery)    Assessment:     Exercise Activities and Dietary recommendations Current Exercise Habits: Home exercise routine, Time (Minutes): 15, Frequency (Times/Week): 7, Weekly Exercise (Minutes/Week): 105, Intensity: Mild, Exercise limited by: orthopedic condition(s)  Goals    . Increase physical activity    . Increase water intake          Recommend drinking at least 3-4 glasses of water a day      Fall Risk Fall Risk  06/16/2016 04/01/2016 03/06/2015  Falls in the past year? Yes No No  Number falls in past yr: 1 - -  Injury with Fall? No - -  Risk for fall due to : Impaired balance/gait - -  Follow up Falls prevention  discussed - -   Depression Screen PHQ 2/9 Scores 06/16/2016 04/01/2016 03/06/2015  PHQ - 2 Score 0 0 0     Cognitive Function     6CIT Screen 06/16/2016 04/01/2016  What Year? 0 points 0 points  What month? 0 points 0 points  What time? 0 points 0 points  Count back from 20 0 points 0 points  Months in reverse 0 points 0 points  Repeat phrase 0 points 0 points  Total Score 0 0    Immunization History  Administered Date(s) Administered  . Influenza,inj,quad, With Preservative 09/07/2015  . Influenza-Unspecified 10/07/2014  . Pneumococcal Conjugate-13 11/21/2013  . Pneumococcal Polysaccharide-23 01/07/2006  . Pneumococcal-Unspecified 03/06/2015  . Zoster 01/08/2007  . Zoster Recombinat (Shingrix) 04/01/2016   Screening Tests Health  Maintenance  Topic Date Due  . TETANUS/TDAP  03/05/2020 (Originally 12/20/1959)  . INFLUENZA VACCINE  08/06/2016  . MAMMOGRAM  03/11/2017  . DEXA SCAN  Addressed  . PNA vac Low Risk Adult  Completed      Plan:    I have personally reviewed and addressed the Medicare Annual Wellness questionnaire and have noted the following in the patient's chart:  A. Medical and social history B. Use of alcohol, tobacco or illicit drugs  C. Current medications and supplements D. Functional ability and status E.  Nutritional status F.  Physical activity G. Advance directives H. List of other physicians I.  Hospitalizations, surgeries, and ER visits in previous 12 months J.  Vitals K. Screenings such as hearing and vision if needed, cognitive and depression L. Referrals and appointments   In addition, I have reviewed and discussed with patient certain preventive protocols, quality metrics, and best practice recommendations. A written personalized care plan for preventive services as well as general preventive health recommendations were provided to patient.   Signed,  Marin Roberts, LPN Nurse Health Advisor   MD Recommendations: none

## 2016-06-16 NOTE — Telephone Encounter (Signed)
Lm for pt to c/b direct to me at [phone] to discuss wheelchair ramp and help with chores after knee surgery. knb

## 2016-08-25 ENCOUNTER — Ambulatory Visit: Payer: Medicare Other | Admitting: Internal Medicine

## 2016-12-07 ENCOUNTER — Other Ambulatory Visit: Payer: Self-pay | Admitting: Internal Medicine

## 2016-12-07 DIAGNOSIS — I10 Essential (primary) hypertension: Secondary | ICD-10-CM

## 2017-02-04 ENCOUNTER — Other Ambulatory Visit: Payer: Self-pay | Admitting: Internal Medicine

## 2017-02-04 DIAGNOSIS — Z1231 Encounter for screening mammogram for malignant neoplasm of breast: Secondary | ICD-10-CM

## 2017-03-06 ENCOUNTER — Encounter: Payer: Self-pay | Admitting: Internal Medicine

## 2017-03-06 ENCOUNTER — Ambulatory Visit (INDEPENDENT_AMBULATORY_CARE_PROVIDER_SITE_OTHER): Payer: Medicare Other | Admitting: Internal Medicine

## 2017-03-06 ENCOUNTER — Ambulatory Visit
Admission: RE | Admit: 2017-03-06 | Discharge: 2017-03-06 | Disposition: A | Discharge status: Home / Self Care | Payer: Medicare Other | Source: Ambulatory Visit | Attending: Internal Medicine | Admitting: Internal Medicine

## 2017-03-06 VITALS — BP 128/80 | HR 78 | Ht 60.0 in | Wt 193.0 lb

## 2017-03-06 DIAGNOSIS — M25511 Pain in right shoulder: Secondary | ICD-10-CM

## 2017-03-06 DIAGNOSIS — Z1231 Encounter for screening mammogram for malignant neoplasm of breast: Secondary | ICD-10-CM

## 2017-03-06 DIAGNOSIS — Z0000 Encounter for general adult medical examination without abnormal findings: Secondary | ICD-10-CM

## 2017-03-06 DIAGNOSIS — I10 Essential (primary) hypertension: Secondary | ICD-10-CM

## 2017-03-06 DIAGNOSIS — E781 Pure hyperglyceridemia: Secondary | ICD-10-CM

## 2017-03-06 DIAGNOSIS — Z0001 Encounter for general adult medical examination with abnormal findings: Secondary | ICD-10-CM | POA: Diagnosis not present

## 2017-03-06 DIAGNOSIS — M19011 Primary osteoarthritis, right shoulder: Secondary | ICD-10-CM | POA: Insufficient documentation

## 2017-03-06 DIAGNOSIS — G8929 Other chronic pain: Secondary | ICD-10-CM

## 2017-03-06 DIAGNOSIS — K219 Gastro-esophageal reflux disease without esophagitis: Secondary | ICD-10-CM

## 2017-03-06 LAB — POCT URINALYSIS DIPSTICK
Bilirubin, UA: NEGATIVE
Blood, UA: NEGATIVE
GLUCOSE UA: NEGATIVE
Ketones, UA: NEGATIVE
LEUKOCYTES UA: NEGATIVE
NITRITE UA: NEGATIVE
PROTEIN UA: NEGATIVE
Urobilinogen, UA: 0.2 E.U./dL
pH, UA: 6.5 (ref 5.0–8.0)

## 2017-03-06 MED ORDER — METOPROLOL SUCCINATE ER 50 MG PO TB24: 50.0000 mg | ORAL_TABLET | Freq: Every day | ORAL | 3 refills | Status: DC

## 2017-03-06 MED ORDER — RANITIDINE HCL 300 MG PO TABS
300.0000 mg | ORAL_TABLET | Freq: Every day | ORAL | 5 refills | Status: DC
Start: 2017-03-06 — End: 2018-01-28

## 2017-03-06 MED ORDER — OMEPRAZOLE 20 MG PO CPDR: 20.0000 mg | DELAYED_RELEASE_CAPSULE | Freq: Every day | ORAL | 0 refills | Status: DC

## 2017-03-06 NOTE — Patient Instructions (Signed)
Take Tylenol 650 -1000 mg at bedtime for shoulder pain Use ice or heat during the day as needed

## 2017-03-06 NOTE — Progress Notes (Signed)
Date:  03/06/2017   Name:  Maria Gibson   DOB:  1940-07-01   MRN:  [id number]   Chief Complaint: Annual Exam (Breast Exam. ) and Shoulder Pain (Right shoulder. Was told years ago by shoulder surgeon that she hurt it badly. Still having pains and wants checked out. ) Maria Gibson is a 77 y.o. female who presents today for her Complete Annual Exam. She feels fairly well. She reports exercising none due to OA knees and shoulder. She reports she is sleeping fairly well unless her shoulder is hurting.   Hypertension  This is a chronic problem. The problem is controlled. Pertinent negatives include no chest pain, headaches, palpitations or shortness of breath. Past treatments include beta blockers. The current treatment provides significant improvement.  Gastroesophageal Reflux  She complains of heartburn. She reports no abdominal pain, no chest pain, no choking, no coughing, no sore throat or no wheezing. This is a recurrent problem. The problem occurs frequently. The problem has been gradually worsening. Pertinent negatives include no fatigue. She has tried a PPI (stopped PPI a while ago due to concerns over chronic treatment) for the symptoms.  Hyperlipidemia  Recent lipid tests were reviewed and are variable. Pertinent negatives include no chest pain or shortness of breath. Current antihyperlipidemic treatment includes diet change.  Shoulder Pain   The pain is present in the right shoulder. This is a chronic problem. The current episode started more than 1 year ago. The problem occurs daily. The problem has been gradually worsening. The quality of the pain is described as aching. The pain is moderate. Pertinent negatives include no fever. The symptoms are aggravated by lying down. She has tried nothing for the symptoms.   Lab Results  Component Value Date   CHOL 184 04/01/2016   HDL 68 04/01/2016   LDLCALC 86 04/01/2016   TRIG 149 04/01/2016   CHOLHDL 2.7 04/01/2016    Review  of Systems  Constitutional: Negative for chills, fatigue and fever.  HENT: Negative for congestion, hearing loss, sore throat, tinnitus, trouble swallowing and voice change.   Eyes: Negative for visual disturbance.  Respiratory: Negative for cough, choking, chest tightness, shortness of breath and wheezing.   Cardiovascular: Negative for chest pain, palpitations and leg swelling.  Gastrointestinal: Positive for heartburn. Negative for abdominal pain, constipation, diarrhea and vomiting.  Endocrine: Negative for polydipsia and polyuria.  Genitourinary: Negative for dysuria, frequency, genital sores, vaginal bleeding and vaginal discharge.  Musculoskeletal: Negative for arthralgias, gait problem and joint swelling.  Skin: Negative for color change and rash.  Neurological: Negative for dizziness, tremors, light-headedness and headaches.  Hematological: Negative for adenopathy. Does not bruise/bleed easily.  Psychiatric/Behavioral: Negative for dysphoric mood and sleep disturbance. The patient is not nervous/anxious.     Patient Active Problem List   Diagnosis Date Noted  . Arthritis of right knee 04/15/2016  . Blepharoptosis, acquired, left 04/15/2016  . Venous insufficiency of both lower extremities 10/17/2015  . OA (osteoarthritis) of ankle 10/17/2015  . Hx of completed stroke 12/01/2014  . Essential (primary) hypertension 12/01/2014  . GERD without esophagitis 12/01/2014  . Hypertriglyceridemia 12/01/2014  . Degenerative arthritis of finger 12/01/2014  . Urge incontinence of urine 12/01/2014  . Sequelae of cerebral infarction 12/01/2014    Prior to Admission medications   Medication Sig Start Date End Date Taking? Authorizing Provider  aspirin 325 MG tablet Take 1 tablet by mouth daily.    [provider]  Calcium Carb-Cholecalciferol (CALCIUM + D3) 600-200  MG-UNIT TABS Take by mouth.    [provider]  metoprolol succinate (TOPROL-XL) 50 MG 24 hr tablet TAKE 1  TABLET BY MOUTH DAILY( WITH OR IMMEDIATELY FOLLOWING A MEAL) 12/07/16   Reubin Milan, MD  Multiple Vitamins-Minerals (MULTIVITAMIN ADULTS 50+ PO) Take by mouth.    [provider]  omeprazole (PRILOSEC) 20 MG capsule Take 1 capsule by mouth daily. 04/13/16   [provider]    Allergies  Allergen Reactions  . Codeine   . Morphine Sulfate   . Shellfish Allergy   . Sulfa Antibiotics Nausea And Vomiting    Past Surgical History:  Procedure Laterality Date  . INTRACAPSULAR CATARACT EXTRACTION Bilateral 2017  . KNEE SURGERY    . TOTAL SHOULDER REPLACEMENT Left   . TUBAL LIGATION    . VARICOSE VEIN SURGERY      Social History   Tobacco Use  . Smoking status: Never Smoker  . Smokeless tobacco: Never Used  Substance Use Topics  . Alcohol use: Yes    Alcohol/week: 0.6 oz    Types: 1 Glasses of wine per week    Comment: occasional  . Drug use: No     Medication list has been reviewed and updated.  PHQ 2/9 Scores 06/16/2016 04/01/2016 03/06/2015  PHQ - 2 Score 0 0 0    Physical Exam  Constitutional: She is oriented to person, place, and time. She appears well-developed and well-nourished. No distress.  HENT:  Head: Normocephalic and atraumatic.  Right Ear: Tympanic membrane and ear canal normal.  Left Ear: Tympanic membrane and ear canal normal.  Nose: Right sinus exhibits no maxillary sinus tenderness. Left sinus exhibits no maxillary sinus tenderness.  Mouth/Throat: Uvula is midline and oropharynx is clear and moist.  Eyes: Conjunctivae and EOM are normal. Right eye exhibits no discharge. Left eye exhibits no discharge. No scleral icterus.  Neck: Normal range of motion. Carotid bruit is not present. No erythema present. No thyromegaly present.  Cardiovascular: Normal rate, regular rhythm, normal heart sounds and normal pulses.  Pulmonary/Chest: Effort normal. No respiratory distress. She has no wheezes. Right breast exhibits no mass, no nipple discharge,  no skin change and no tenderness. Left breast exhibits no mass, no nipple discharge, no skin change and no tenderness.  Abdominal: Soft. Bowel sounds are normal. There is no hepatosplenomegaly. There is no tenderness. There is no CVA tenderness.  Musculoskeletal: Normal range of motion.  Lymphadenopathy:    She has no cervical adenopathy.    She has no axillary adenopathy.  Neurological: She is alert and oriented to person, place, and time. She has normal reflexes. No cranial nerve deficit or sensory deficit.  Skin: Skin is warm, dry and intact. No rash noted.  Psychiatric: She has a normal mood and affect. Her speech is normal and behavior is normal. Thought content normal.  Nursing note and vitals reviewed.   BP 128/80   Pulse 78   Ht 5' (1.524 m)   Wt 183 lb (83 kg)   SpO2 98%   BMI 35.74 kg/m   Assessment and Plan: 1. Annual physical exam Mammogram scheduled MAW done last June  2. Essential (primary) hypertension controlled - CBC with Differential/Platelet - Comprehensive metabolic panel - TSH - POCT urinalysis dipstick - metoprolol succinate (TOPROL-XL) 50 MG 24 hr tablet; Take 1 tablet (50 mg total) by mouth daily. Take with or immediately following a meal.  Dispense: 90 tablet; Refill: 3  3. GERD without esophagitis Resume PPI for one month  then transition to daily H2 blocker - CBC with Differential/Platelet - omeprazole (PRILOSEC) 20 MG capsule; Take 1 capsule (20 mg total) by mouth daily.  Dispense: 30 capsule; Refill: 0 - ranitidine (ZANTAC) 300 MG tablet; Take 1 tablet (300 mg total) by mouth at bedtime.  Dispense: 30 tablet; Refill: 5  4. Hypertriglyceridemia Check labs Low fat diet - Lipid panel  5. Encounter for screening mammogram for breast cancer scheduled  6. Chronic right shoulder pain Likely OA Take tylenol at HS and use ice or heat during the day - DG Shoulder Right; Future   Meds ordered this encounter  Medications  . metoprolol succinate  (TOPROL-XL) 50 MG 24 hr tablet    Sig: Take 1 tablet (50 mg total) by mouth daily. Take with or immediately following a meal.    Dispense:  90 tablet    Refill:  3  . omeprazole (PRILOSEC) 20 MG capsule    Sig: Take 1 capsule (20 mg total) by mouth daily.    Dispense:  30 capsule    Refill:  0  . ranitidine (ZANTAC) 300 MG tablet    Sig: Take 1 tablet (300 mg total) by mouth at bedtime.    Dispense:  30 tablet    Refill:  5    Partially dictated using Animal nutritionistDragon software. Any errors are unintentional.  Bari EdwardLaura Maziah Keeling, MD [facility]  03/06/2017

## 2017-03-07 LAB — CBC WITH DIFFERENTIAL/PLATELET
BASOS: 0 %
Basophils Absolute: 0 10*3/uL (ref 0.0–0.2)
EOS (ABSOLUTE): 0.1 10*3/uL (ref 0.0–0.4)
EOS: 2 %
HEMATOCRIT: 46.1 % (ref 34.0–46.6)
Hemoglobin: 16 g/dL — ABNORMAL HIGH (ref 11.1–15.9)
IMMATURE GRANS (ABS): 0 10*3/uL (ref 0.0–0.1)
IMMATURE GRANULOCYTES: 0 %
LYMPHS: 38 %
Lymphocytes Absolute: 2.2 10*3/uL (ref 0.7–3.1)
MCH: 32.5 pg (ref 26.6–33.0)
MCHC: 34.7 g/dL (ref 31.5–35.7)
MCV: 94 fL (ref 79–97)
MONOCYTES: 9 %
Monocytes Absolute: 0.5 10*3/uL (ref 0.1–0.9)
Neutrophils Absolute: 3.1 10*3/uL (ref 1.4–7.0)
Neutrophils: 51 %
Platelets: 225 10*3/uL (ref 150–379)
RBC: 4.93 x10E6/uL (ref 3.77–5.28)
RDW: 13.4 % (ref 12.3–15.4)
WBC: 5.9 10*3/uL (ref 3.4–10.8)

## 2017-03-07 LAB — LIPID PANEL
CHOL/HDL RATIO: 2.4 ratio (ref 0.0–4.4)
Cholesterol, Total: 180 mg/dL (ref 100–199)
HDL: 75 mg/dL (ref >39)
LDL CALC: 78 mg/dL (ref 0–99)
Triglycerides: 134 mg/dL (ref 0–149)
VLDL Cholesterol Cal: 27 mg/dL (ref 5–40)

## 2017-03-07 LAB — COMPREHENSIVE METABOLIC PANEL
A/G RATIO: 1.6 (ref 1.2–2.2)
ALT: 24 IU/L (ref 0–32)
AST: 22 IU/L (ref 0–40)
Albumin: 4.4 g/dL (ref 3.5–4.8)
Alkaline Phosphatase: 72 IU/L (ref 39–117)
BUN/Creatinine Ratio: 19 (ref 12–28)
BUN: 16 mg/dL (ref 8–27)
Bilirubin Total: 0.4 mg/dL (ref 0.0–1.2)
CO2: 25 mmol/L (ref 20–29)
CREATININE: 0.84 mg/dL (ref 0.57–1.00)
Calcium: 9.2 mg/dL (ref 8.7–10.3)
Chloride: 103 mmol/L (ref 96–106)
GFR, EST AFRICAN AMERICAN: 78 mL/min/{1.73_m2} (ref >59)
GFR, EST NON AFRICAN AMERICAN: 67 mL/min/{1.73_m2} (ref >59)
GLOBULIN, TOTAL: 2.7 g/dL (ref 1.5–4.5)
Glucose: 85 mg/dL (ref 65–99)
POTASSIUM: 4.5 mmol/L (ref 3.5–5.2)
SODIUM: 144 mmol/L (ref 134–144)
TOTAL PROTEIN: 7.1 g/dL (ref 6.0–8.5)

## 2017-03-07 LAB — TSH: TSH: 3 u[IU]/mL (ref 0.450–4.500)

## 2017-03-09 ENCOUNTER — Encounter: Payer: Self-pay | Admitting: Internal Medicine

## 2017-03-12 ENCOUNTER — Ambulatory Visit: Payer: Medicare Other

## 2017-03-16 ENCOUNTER — Ambulatory Visit
Admission: RE | Admit: 2017-03-16 | Discharge: 2017-03-16 | Disposition: A | Discharge status: Home / Self Care | Payer: Medicare Other | Source: Ambulatory Visit | Attending: Internal Medicine | Admitting: Internal Medicine

## 2017-03-16 DIAGNOSIS — Z1231 Encounter for screening mammogram for malignant neoplasm of breast: Secondary | ICD-10-CM | POA: Diagnosis present

## 2017-06-24 ENCOUNTER — Ambulatory Visit (INDEPENDENT_AMBULATORY_CARE_PROVIDER_SITE_OTHER): Payer: Medicare Other

## 2017-06-24 VITALS — BP 138/80 | HR 73 | Temp 97.8°F | Resp 12 | Ht 60.0 in | Wt 211.4 lb

## 2017-06-24 DIAGNOSIS — Z0000 Encounter for general adult medical examination without abnormal findings: Secondary | ICD-10-CM | POA: Diagnosis not present

## 2017-06-24 NOTE — Progress Notes (Signed)
Subjective:   Maria Gibson is a 77 y.o. female who presents for Medicare Annual (Subsequent) preventive examination.  Review of Systems:  N/A Cardiac Risk Factors include: advanced age (>10men, >70 women);hypertension;obesity (BMI >30kg/m2);sedentary lifestyle     Objective:     Vitals: BP 138/80 (BP Location: Right Arm, Patient Position: Sitting, Cuff Size: Large)   Pulse 73   Temp 97.8 F (36.6 C) (Oral)   Resp 12   Ht 5' (1.524 m)   Wt 211 lb 6.4 oz (95.9 kg)   SpO2 92%   BMI 41.29 kg/m   Body mass index is 41.29 kg/m.  Advanced Directives 06/24/2017 06/16/2016 04/01/2016 03/06/2015  Does Patient Have a Medical Advance Directive? Yes Yes Yes No  Type of Estate agent of [place];Living will Healthcare Power of [place];Living will Healthcare Power of [place];Living will -  Copy of Healthcare Power of Attorney in Chart? No - copy requested No - copy requested No - copy requested -  Would patient like information on creating a medical advance directive? - - - No - patient declined information    Tobacco Social History   Tobacco Use  Smoking Status Never Smoker  Smokeless Tobacco Never Used  Tobacco Comment   smoking cessation materials not required     Counseling given: No Comment: smoking cessation materials not required  Clinical Intake:  Pre-visit preparation completed: Yes  Pain : No/denies pain   BMI - recorded: 35.74 Nutritional Status: BMI > 30  Obese Nutritional Risks: None Diabetes: No  How often do you need to have someone help you when you read instructions, pamphlets, or other written materials from your doctor or pharmacy?: 1 - Never  Interpreter Needed?: No  Information entered by :: AEversole, LPN  Past Medical History:  Diagnosis Date  . Hypertension    Past Surgical History:  Procedure Laterality Date  . INTRACAPSULAR CATARACT EXTRACTION Bilateral 2017  . KNEE SURGERY    . TOTAL SHOULDER REPLACEMENT Left    . TUBAL LIGATION    . VARICOSE VEIN SURGERY     Family History  Problem Relation Age of Onset  . Heart failure Mother   . Diabetes Father   . Heart failure Father   . Breast cancer Neg Hx    Social History   Socioeconomic History  . Marital status: Widowed    Spouse name: Not on file  . Number of children: 2  . Years of education: Not on file  . Highest education level: 12th grade  Occupational History  . Occupation: Retired  Engineer, production  . Financial resource strain: Not hard at all  . Food insecurity:    Worry: Never true    Inability: Never true  . Transportation needs:    Medical: No    Non-medical: No  Tobacco Use  . Smoking status: Never Smoker  . Smokeless tobacco: Never Used  . Tobacco comment: smoking cessation materials not required  Substance and Sexual Activity  . Alcohol use: Yes    Alcohol/week: 0.6 oz    Types: 1 Glasses of wine per week    Comment: occasional  . Drug use: No  . Sexual activity: Not Currently  Lifestyle  . Physical activity:    Days per week: 0 days    Minutes per session: 0 min  . Stress: Not at all  Relationships  . Social connections:    Talks on phone: Patient refused    Gets together: Patient refused    Attends  religious service: Patient refused    Active member of club or organization: Patient refused    Attends meetings of clubs or organizations: Patient refused    Relationship status: Widowed  Other Topics Concern  . Not on file  Social History Narrative  . Not on file    Outpatient Encounter Medications as of 06/24/2017  Medication Sig  . aspirin 325 MG tablet Take 1 tablet by mouth daily.  . Calcium Carb-Cholecalciferol (CALCIUM + D3) 600-200 MG-UNIT TABS Take by mouth.  . metoprolol succinate (TOPROL-XL) 50 MG 24 hr tablet Take 1 tablet (50 mg total) by mouth daily. Take with or immediately following a meal.  . Multiple Vitamins-Minerals (MULTIVITAMIN ADULTS 50+ PO) Take by mouth.  Marland Kitchen. omeprazole (PRILOSEC) 20  MG capsule Take 1 capsule (20 mg total) by mouth daily.  Marland Kitchen. OVER THE COUNTER MEDICATION Take 2 capsules by mouth daily.  Marland Kitchen. OVER THE COUNTER MEDICATION Take 3 tablets by mouth daily.  . ranitidine (ZANTAC) 300 MG tablet Take 1 tablet (300 mg total) by mouth at bedtime. (Patient not taking: Reported on 06/24/2017)   No facility-administered encounter medications on file as of 06/24/2017.     Activities of Daily Living In your present state of health, do you have any difficulty performing the following activities: 06/24/2017  Hearing? N  Comment denies hearing aids  Vision? N  Comment wears eyeglasses  Difficulty concentrating or making decisions? N  Walking or climbing stairs? Y  Comment joint pain  Dressing or bathing? N  Doing errands, shopping? N  Preparing Food and eating ? N  Comment denies dentures  Using the Toilet? N  In the past six months, have you accidently leaked urine? Y  Comment stress incontinence  Do you have problems with loss of bowel control? N  Managing your Medications? N  Managing your Finances? N  Housekeeping or managing your Housekeeping? N  Some recent data might be hidden    Patient Care Team: Reubin MilanBerglund, Laura H, MD as PCP - General (Family Medicine)    Assessment:   This is a routine wellness examination for Maria RegalCarol.  Exercise Activities and Dietary recommendations Current Exercise Habits: The patient does not participate in regular exercise at present, Exercise limited by: None identified  Goals    . DIET - INCREASE WATER INTAKE     Recommend to drink at least 6-8 8oz glasses of water per day.    . Increase physical activity       Fall Risk Fall Risk  06/24/2017 06/16/2016 04/01/2016 03/06/2015  Falls in the past year? No Yes No No  Number falls in past yr: - 1 - -  Injury with Fall? - No - -  Risk for fall due to : Impaired vision;Impaired balance/gait;Other (Comment) Impaired balance/gait - -  Risk for fall due to: Comment wears eyeglasses;  arthritis - - -  Follow up - Falls prevention discussed - -  Comment - got railings installed on front porch- will eval for ramp - -   FALL RISK PREVENTION PERTAINING TO HOME: Is your home free of loose throw rugs in walkways, pet beds, electrical cords, etc? Yes Is there adequate lighting in your home to reduce risk of falls?  Yes Are there stairs in or around your home WITH handrails? Yes  ASSISTIVE DEVICES UTILIZED TO PREVENT FALLS: Use of a cane, walker or w/c? No Grab bars in the bathroom? Yes  Shower chair or a place to sit while bathing? No An elevated toilet  seat or a handicapped toilet? No  Timed Get Up and Go Performed: Yes. Pt ambulated 10 feet within 22 sec. Gait slow, steady and without the use of an assistive device. No intervention required at this time. Fall risk prevention has been discussed.  Community Resource Referral:  Pt declined my offer to send [employer] Referral to Care Guide for a shower chair or an elevated toilet seat.  Depression Screen PHQ 2/9 Scores 06/24/2017 06/16/2016 04/01/2016 03/06/2015  PHQ - 2 Score 0 0 0 0  PHQ- 9 Score 0 - - -     Cognitive Function     6CIT Screen 06/24/2017 06/16/2016 04/01/2016  What Year? 0 points 0 points 0 points  What month? 0 points 0 points 0 points  What time? 0 points 0 points 0 points  Count back from 20 0 points 0 points 0 points  Months in reverse 0 points 0 points 0 points  Repeat phrase 0 points 0 points 0 points  Total Score 0 0 0    Immunization History  Administered Date(s) Administered  . Influenza,inj,quad, With Preservative 09/07/2015  . Influenza-Unspecified 10/07/2014, 09/06/2016  . Pneumococcal Conjugate-13 11/21/2013  . Pneumococcal Polysaccharide-23 01/07/2006  . Pneumococcal-Unspecified 03/06/2015  . Zoster 01/08/2007  . Zoster Recombinat (Shingrix) 04/01/2016    Qualifies for Shingles Vaccine? Yes. Zostavax completed 01/08/07. Due for final dose of Shingrix. First dose received  04/01/16. Advised to bring in a copy of her final vaccination record. Verbalized acceptance and understanding.  Due for Tdap vaccine. Education has been provided regarding the importance of this vaccine. Pt has been advised she may receive this vaccine at her local pharmacy or Health Dept. Also advised to provide a copy of her vaccination record if she chooses to receive this vaccine at her local pharmacy. Verbalized acceptance and understanding.  Screening Tests Health Maintenance  Topic Date Due  . TETANUS/TDAP  03/05/2020 (Originally 12/20/1959)  . INFLUENZA VACCINE  08/06/2017  . MAMMOGRAM  03/17/2018  . PNA vac Low Risk Adult  Completed  . DEXA SCAN  Addressed    Cancer Screenings: Lung: Low Dose CT Chest recommended if Age 31-80 years, 30 pack-year currently smoking OR have quit w/in 15years. Patient does not qualify. Breast:  Up to date on Mammogram? Yes. Completed 03/16/17. Repeat every year   Up to date of Bone Density/Dexa? Yes. Completed 01/07/09. Osteoporotic screenings no longer required. Colorectal: No longer required  Additional Screenings: Hepatitis C Screening: Does not qualify    Plan:  I have personally reviewed and addressed the Medicare Annual Wellness questionnaire and have noted the following in the patient's chart:  A. Medical and social history B. Use of alcohol, tobacco or illicit drugs  C. Current medications and supplements D. Functional ability and status E.  Nutritional status F.  Physical activity G. Advance directives H. List of other physicians I.  Hospitalizations, surgeries, and ER visits in previous 12 months J.  Vitals K. Screenings such as hearing and vision if needed, cognitive and depression L. Referrals and appointments  In addition, I have reviewed and discussed with patient certain preventive protocols, quality metrics, and best practice recommendations. A written personalized care plan for preventive services as well as general preventive  health recommendations were provided to patient.  Signed,  Deon Pilling, LPN Nurse Health Advisor  MD Recommendations: Zostavax completed 01/08/07. Due for final dose of Shingrix. First dose received 04/01/16. Advised to bring in a copy of her final vaccination record. Verbalized acceptance and understanding.  Due for Tdap vaccine. Education has been provided regarding the importance of this vaccine. Pt has been advised she may receive this vaccine at her local pharmacy or Health Dept. Also advised to provide a copy of her vaccination record if she chooses to receive this vaccine at her local pharmacy. Verbalized acceptance and understanding.

## 2017-06-24 NOTE — Patient Instructions (Addendum)
Maria Gibson , Thank you for taking time to come for your Medicare Wellness Visit. I appreciate your ongoing commitment to your health goals. Please review the following plan we discussed and let me know if I can assist you in the future.   Screening recommendations/referrals: Colorectal Screening: No longer required Mammogram: Up to date Bone Density: No longer required  Vision and Dental Exams: Recommended annual ophthalmology exams for early detection of glaucoma and other disorders of the eye Recommended annual dental exams for proper oral hygiene  Vaccinations: Influenza vaccine: Up to date Pneumococcal vaccine: Up to date Tdap vaccine: Please call your insurance company to determine your out of pocket expense. You may also receive this vaccine at your local pharmacy or Health Dept. Shingles vaccine: Please complete your final dose by 10/05/2016 then provide our office with a copy of your vaccination record    Advanced directives: Please bring a copy of your POA (Power of [place]remont[place]orney) and/or Living Will to your next appointment.  Goals: Recommend to drink at least 6-8 8oz glasses of water per day.  Next appointment: Please schedule your Annual Wellness Visit with your Nurse Health Advisor in one year.  Preventive Care 9165 Years and Older, Female Preventive care refers to lifestyle choices and visits with your health care provider that can promote health and wellness. What does preventive care include?  A yearly physical exam. This is also called an annual well check.  Dental exams once or twice a year.  Routine eye exams. Ask your health care provider how often you should have your eyes checked.  Personal lifestyle choices, including:  Daily care of your teeth and gums.  Regular physical activity.  Eating a healthy diet.  Avoiding tobacco and drug use.  Limiting alcohol use.  Practicing safe sex.  Taking low-dose aspirin every day.  Taking vitamin and mineral  supplements as recommended by your health care provider. What happens during an annual well check? The services and screenings done by your health care provider during your annual well check will depend on your age, overall health, lifestyle risk factors, and family history of disease. Counseling  Your health care provider may ask you questions about your:  Alcohol use.  Tobacco use.  Drug use.  Emotional well-being.  Home and relationship well-being.  Sexual activity.  Eating habits.  History of falls.  Memory and ability to understand (cognition).  Work and work Astronomerenvironment.  Reproductive health. Screening  You may have the following tests or measurements:  Height, weight, and BMI.  Blood pressure.  Lipid and cholesterol levels. These may be checked every 5 years, or more frequently if you are over 78 years old.  Skin check.  Lung cancer screening. You may have this screening every year starting at age 78 if you have a 30-pack-year history of smoking and currently smoke or have quit within the past 15 years.  Fecal occult blood test (FOBT) of the stool. You may have this test every year starting at age 78.  Flexible sigmoidoscopy or colonoscopy. You may have a sigmoidoscopy every 5 years or a colonoscopy every 10 years starting at age 78.  Hepatitis C blood test.  Hepatitis B blood test.  Sexually transmitted disease (STD) testing.  Diabetes screening. This is done by checking your blood sugar (glucose) after you have not eaten for a while (fasting). You may have this done every 1-3 years.  Bone density scan. This is done to screen for osteoporosis. You may have this done starting  at age 8.  Mammogram. This may be done every 1-2 years. Talk to your health care provider about how often you should have regular mammograms. Talk with your health care provider about your test results, treatment options, and if necessary, the need for more tests. Vaccines  Your  health care provider may recommend certain vaccines, such as:  Influenza vaccine. This is recommended every year.  Tetanus, diphtheria, and acellular pertussis (Tdap, Td) vaccine. You may need a Td booster every 10 years.  Zoster vaccine. You may need this after age 55.  Pneumococcal 13-valent conjugate (PCV13) vaccine. One dose is recommended after age 56.  Pneumococcal polysaccharide (PPSV23) vaccine. One dose is recommended after age 28. Talk to your health care provider about which screenings and vaccines you need and how often you need them. This information is not intended to replace advice given to you by your health care provider. Make sure you discuss any questions you have with your health care provider. Document Released: 01/19/2015 Document Revised: 09/12/2015 Document Reviewed: 10/24/2014 Elsevier Interactive Patient Education  2017 [place] Prevention in the Home Falls can cause injuries. They can happen to people of all ages. There are many things you can do to make your home safe and to help prevent falls. What can I do on the outside of my home?  Regularly fix the edges of walkways and driveways and fix any cracks.  Remove anything that might make you trip as you walk through a door, such as a raised step or threshold.  Trim any bushes or trees on the path to your home.  Use bright outdoor lighting.  Clear any walking paths of anything that might make someone trip, such as rocks or tools.  Regularly check to see if handrails are loose or broken. Make sure that both sides of any steps have handrails.  Any raised decks and porches should have guardrails on the edges.  Have any leaves, snow, or ice cleared regularly.  Use sand or salt on walking paths during winter.  Clean up any spills in your garage right away. This includes oil or grease spills. What can I do in the bathroom?  Use night lights.  Install grab bars by the toilet and in the tub and  shower. Do not use towel bars as grab bars.  Use non-skid mats or decals in the tub or shower.  If you need to sit down in the shower, use a plastic, non-slip stool.  Keep the floor dry. Clean up any water that spills on the floor as soon as it happens.  Remove soap buildup in the tub or shower regularly.  Attach bath mats securely with double-sided non-slip rug tape.  Do not have throw rugs and other things on the floor that can make you trip. What can I do in the bedroom?  Use night lights.  Make sure that you have a light by your bed that is easy to reach.  Do not use any sheets or blankets that are too big for your bed. They should not hang down onto the floor.  Have a firm chair that has side arms. You can use this for support while you get dressed.  Do not have throw rugs and other things on the floor that can make you trip. What can I do in the kitchen?  Clean up any spills right away.  Avoid walking on wet floors.  Keep items that you use a lot in easy-to-reach places.  If  you need to reach something above you, use a strong step stool that has a grab bar.  Keep electrical cords out of the way.  Do not use floor polish or wax that makes floors slippery. If you must use wax, use non-skid floor wax.  Do not have throw rugs and other things on the floor that can make you trip. What can I do with my stairs?  Do not leave any items on the stairs.  Make sure that there are handrails on both sides of the stairs and use them. Fix handrails that are broken or loose. Make sure that handrails are as long as the stairways.  Check any carpeting to make sure that it is firmly attached to the stairs. Fix any carpet that is loose or worn.  Avoid having throw rugs at the top or bottom of the stairs. If you do have throw rugs, attach them to the floor with carpet tape.  Make sure that you have a light switch at the top of the stairs and the bottom of the stairs. If you do not  have them, ask someone to add them for you. What else can I do to help prevent falls?  Wear shoes that:  Do not have high heels.  Have rubber bottoms.  Are comfortable and fit you well.  Are closed at the toe. Do not wear sandals.  If you use a stepladder:  Make sure that it is fully opened. Do not climb a closed stepladder.  Make sure that both sides of the stepladder are locked into place.  Ask someone to hold it for you, if possible.  Clearly mark and make sure that you can see:  Any grab bars or handrails.  First and last steps.  Where the edge of each step is.  Use tools that help you move around (mobility aids) if they are needed. These include:  Canes.  Walkers.  Scooters.  Crutches.  Turn on the lights when you go into a dark area. Replace any light bulbs as soon as they burn out.  Set up your furniture so you have a clear path. Avoid moving your furniture around.  If any of your floors are uneven, fix them.  If there are any pets around you, be aware of where they are.  Review your medicines with your doctor. Some medicines can make you feel dizzy. This can increase your chance of falling. Ask your doctor what other things that you can do to help prevent falls. This information is not intended to replace advice given to you by your health care provider. Make sure you discuss any questions you have with your health care provider. Document Released: 10/19/2008 Document Revised: 05/31/2015 Document Reviewed: 01/27/2014 Elsevier Interactive Patient Education  2017 [employer].

## 2018-01-28 ENCOUNTER — Ambulatory Visit: Payer: Medicare Other | Admitting: Internal Medicine

## 2018-01-28 ENCOUNTER — Encounter: Payer: Self-pay | Admitting: Internal Medicine

## 2018-01-28 VITALS — BP 114/80 | HR 68 | Temp 97.7°F | Ht 60.0 in | Wt 208.0 lb

## 2018-01-28 DIAGNOSIS — J40 Bronchitis, not specified as acute or chronic: Secondary | ICD-10-CM

## 2018-01-28 DIAGNOSIS — M19011 Primary osteoarthritis, right shoulder: Secondary | ICD-10-CM

## 2018-01-28 DIAGNOSIS — E2839 Other primary ovarian failure: Secondary | ICD-10-CM

## 2018-01-28 DIAGNOSIS — Z1231 Encounter for screening mammogram for malignant neoplasm of breast: Secondary | ICD-10-CM

## 2018-01-28 MED ORDER — BENZONATATE 100 MG PO CAPS: 100.0000 mg | ORAL_CAPSULE | Freq: Three times a day (TID) | ORAL | 0 refills | Status: DC

## 2018-01-28 MED ORDER — GUAIFENESIN-CODEINE 100-10 MG/5ML PO SYRP: 5.0000 mL | ORAL_SOLUTION | Freq: Three times a day (TID) | ORAL | 0 refills | Status: DC | PRN

## 2018-01-28 MED ORDER — DOXYCYCLINE HYCLATE 100 MG PO TABS: 100.0000 mg | ORAL_TABLET | Freq: Two times a day (BID) | ORAL | 0 refills | Status: AC

## 2018-01-28 NOTE — Patient Instructions (Signed)
Claritin or Allegra for post nasal drainage  Continue Tylenol or Advil as needed

## 2018-01-28 NOTE — Progress Notes (Signed)
Date:  01/28/2018   Name:  Maria Gibson   DOB:  August 18, 1940   MRN:  [id number]   Chief Complaint: Cough (X 1 weeks. Cough with sneezy. Eyes watery. Sore throat. Taking Robitussin MAX w no relief. In chest. Worse at night. Can hear wheezing in chest at night.  )  URI   This is a new problem. The current episode started in the past 7 days. The problem has been rapidly worsening. There has been no fever. Associated symptoms include congestion, coughing and wheezing. Pertinent negatives include no chest pain, headaches, nausea, sore throat or vomiting. She has tried acetaminophen, decongestant and increased fluids for the symptoms. The treatment provided mild relief.   Patient is s/p health assessment at work.  They recommended a DEXA.  She is due for mammogram in march so will order both together.  Review of Systems  Constitutional: Positive for fatigue. Negative for chills, fever and unexpected weight change.  HENT: Positive for congestion and postnasal drip. Negative for sinus pressure, sore throat and trouble swallowing.   Respiratory: Positive for cough and wheezing. Negative for chest tightness.   Cardiovascular: Negative for chest pain and palpitations.  Gastrointestinal: Negative for nausea and vomiting.  Neurological: Negative for dizziness, light-headedness and headaches.    Patient Active Problem List   Diagnosis Date Noted  . Chronic right shoulder pain 03/06/2017  . Arthritis of right knee 04/15/2016  . Blepharoptosis, acquired, left 04/15/2016  . Venous insufficiency of both lower extremities 10/17/2015  . OA (osteoarthritis) of ankle 10/17/2015  . Hx of completed stroke 12/01/2014  . Essential (primary) hypertension 12/01/2014  . GERD without esophagitis 12/01/2014  . Hypertriglyceridemia 12/01/2014  . Degenerative arthritis of finger 12/01/2014  . Urge incontinence of urine 12/01/2014  . Sequelae of cerebral infarction 12/01/2014    Allergies  Allergen  Reactions  . Codeine   . Morphine Sulfate   . Shellfish Allergy   . Sulfa Antibiotics Nausea And Vomiting    Past Surgical History:  Procedure Laterality Date  . INTRACAPSULAR CATARACT EXTRACTION Bilateral 2017  . KNEE SURGERY    . TOTAL SHOULDER REPLACEMENT Left   . TUBAL LIGATION    . VARICOSE VEIN SURGERY      Social History   Tobacco Use  . Smoking status: Never Smoker  . Smokeless tobacco: Never Used  . Tobacco comment: smoking cessation materials not required  Substance Use Topics  . Alcohol use: Yes    Alcohol/week: 1.0 standard drinks    Types: 1 Glasses of wine per week    Comment: occasional  . Drug use: No     Medication list has been reviewed and updated.  Current Meds  Medication Sig  . aspirin 81 MG chewable tablet 324 mg.   . Calcium Carb-Cholecalciferol (CALCIUM + D3) 600-200 MG-UNIT TABS Take by mouth.  . docusate sodium (COLACE) 100 MG capsule Take 1 tablet by mouth 2 (two) times daily.  . metoprolol succinate (TOPROL-XL) 50 MG 24 hr tablet Take 1 tablet (50 mg total) by mouth daily. Take with or immediately following a meal.  . Multiple Vitamins-Minerals (MULTIVITAMIN ADULTS 50+ PO) Take by mouth.    PHQ 2/9 Scores 06/24/2017 06/16/2016 04/01/2016 03/06/2015  PHQ - 2 Score 0 0 0 0  PHQ- 9 Score 0 - - -    Physical Exam Vitals signs and nursing note reviewed.  Constitutional:      General: She is not in acute distress.    Appearance: Normal appearance.  She is well-developed.  HENT:     Head: Normocephalic and atraumatic.     Nose: Nose normal.  Neck:     Musculoskeletal: Normal range of motion and neck supple.  Cardiovascular:     Rate and Rhythm: Normal rate and regular rhythm.     Pulses: Normal pulses.  Pulmonary:     Effort: Pulmonary effort is normal. No respiratory distress.     Breath sounds: No stridor. Examination of the right-upper field reveals rhonchi. Examination of the left-upper field reveals rhonchi. Rhonchi (clear with  cough) present. No wheezing.  Musculoskeletal: Normal range of motion.  Skin:    General: Skin is warm and dry.     Findings: No rash.  Neurological:     Mental Status: She is alert and oriented to person, place, and time.  Psychiatric:        Behavior: Behavior normal.        Thought Content: Thought content normal.     BP 114/80   Pulse 68   Temp 97.7 F (36.5 C) (Oral)   Ht 5' (1.524 m)   Wt 208 lb (94.3 kg)   SpO2 95%   BMI 40.62 kg/m   Assessment and Plan: 1. Bronchitis Continue fluids, tylenol as needed, claritin for PND - doxycycline (VIBRA-TABS) 100 MG tablet; Take 1 tablet (100 mg total) by mouth 2 (two) times daily for 10 days.  Dispense: 20 tablet; Refill: 0 - guaiFENesin-codeine (ROBITUSSIN AC) 100-10 MG/5ML syrup; Take 5 mLs by mouth 3 (three) times daily as needed for cough.  Dispense: 118 mL; Refill: 0 - benzonatate (TESSALON) 100 MG capsule; Take 1 capsule (100 mg total) by mouth 3 (three) times daily.  Dispense: 20 capsule; Refill: 0  2. Ovarian failure - DG Bone Density; Future  3. Encounter for screening mammogram for breast cancer - MM 3D SCREEN BREAST BILATERAL; Future  4. Primary osteoarthritis of right shoulder Improved after cortisone injection   Partially dictated using Animal nutritionistDragon software. Any errors are unintentional.  Bari EdwardLaura Aqib Lough, MD [facility]  01/28/2018

## 2018-03-08 ENCOUNTER — Other Ambulatory Visit: Payer: Self-pay

## 2018-03-08 ENCOUNTER — Encounter: Payer: Self-pay | Admitting: Internal Medicine

## 2018-03-08 ENCOUNTER — Ambulatory Visit (INDEPENDENT_AMBULATORY_CARE_PROVIDER_SITE_OTHER): Payer: Medicare Other | Admitting: Internal Medicine

## 2018-03-08 VITALS — BP 118/78 | HR 72 | Ht 60.0 in | Wt 208.4 lb

## 2018-03-08 DIAGNOSIS — Z1231 Encounter for screening mammogram for malignant neoplasm of breast: Secondary | ICD-10-CM

## 2018-03-08 DIAGNOSIS — M19011 Primary osteoarthritis, right shoulder: Secondary | ICD-10-CM | POA: Diagnosis not present

## 2018-03-08 DIAGNOSIS — I10 Essential (primary) hypertension: Secondary | ICD-10-CM

## 2018-03-08 DIAGNOSIS — K219 Gastro-esophageal reflux disease without esophagitis: Secondary | ICD-10-CM | POA: Diagnosis not present

## 2018-03-08 DIAGNOSIS — E781 Pure hyperglyceridemia: Secondary | ICD-10-CM | POA: Diagnosis not present

## 2018-03-08 DIAGNOSIS — Z0000 Encounter for general adult medical examination without abnormal findings: Secondary | ICD-10-CM

## 2018-03-08 LAB — POCT URINALYSIS DIPSTICK
Bilirubin, UA: NEGATIVE
Blood, UA: NEGATIVE
Glucose, UA: NEGATIVE
Ketones, UA: NEGATIVE
Leukocytes, UA: NEGATIVE
Nitrite, UA: NEGATIVE
PROTEIN UA: NEGATIVE
Spec Grav, UA: <=1.005 — AB (ref 1.010–1.025)
Urobilinogen, UA: 0.2 E.U./dL
pH, UA: 6 (ref 5.0–8.0)

## 2018-03-08 MED ORDER — METOPROLOL SUCCINATE ER 50 MG PO TB24: 50.0000 mg | ORAL_TABLET | Freq: Every day | ORAL | 3 refills | Status: DC

## 2018-03-08 NOTE — Progress Notes (Signed)
Date:  03/08/2018   Name:  Maria Gibson   DOB:  May 18, 1940   MRN:  [id number]   Chief Complaint: Annual Exam (Breast Exam. MAW scheduled in JUNE with Rosanne Sack.) Maria Gibson is a 78 y.o. female who presents today for her Complete Annual Exam. She feels fairly well. She reports exercising none. She reports she is sleeping fairly well. Mammogram and DEXA are scheduled. She has chronic ongoing right shoulder pain.  She is not taking anything regularly.  She needs to have surgery for joint replacement but she is hesitant.  She did have a cortisone injection which only provided benefit for a few weeks. She has gained some weight with inactivity so recently started back on [employer].  Hypertension  This is a chronic problem. The problem is controlled. Pertinent negatives include no chest pain, headaches, palpitations or shortness of breath. Past treatments include beta blockers. The current treatment provides significant improvement.   Lab Results  Component Value Date   CREATININE 0.84 03/06/2017   BUN 16 03/06/2017   NA 144 03/06/2017   K 4.5 03/06/2017   CL 103 03/06/2017   CO2 25 03/06/2017   Lab Results  Component Value Date   CHOL 180 03/06/2017   HDL 75 03/06/2017   LDLCALC 78 03/06/2017   TRIG 134 03/06/2017   CHOLHDL 2.4 03/06/2017   Lab Results  Component Value Date   TSH 3.000 03/06/2017     Review of Systems  Constitutional: Negative for chills, fatigue and fever.  HENT: Negative for congestion, hearing loss, tinnitus, trouble swallowing and voice change.   Eyes: Negative for visual disturbance.  Respiratory: Negative for cough, chest tightness, shortness of breath and wheezing.   Cardiovascular: Negative for chest pain, palpitations and leg swelling.  Gastrointestinal: Negative for abdominal pain, constipation, diarrhea and vomiting.  Endocrine: Negative for polydipsia and polyuria.  Genitourinary: Negative for dysuria, frequency, genital sores, vaginal  bleeding and vaginal discharge.  Musculoskeletal: Positive for arthralgias (severe shoulder pain). Negative for gait problem and joint swelling.  Skin: Negative for color change and rash.  Neurological: Negative for dizziness, tremors, light-headedness and headaches.  Hematological: Negative for adenopathy. Does not bruise/bleed easily.  Psychiatric/Behavioral: Negative for dysphoric mood and sleep disturbance. The patient is not nervous/anxious.     Patient Active Problem List   Diagnosis Date Noted  . Osteoarthritis of right shoulder 03/06/2017  . Arthritis of right knee 04/15/2016  . Blepharoptosis, acquired, left 04/15/2016  . Venous insufficiency of both lower extremities 10/17/2015  . OA (osteoarthritis) of ankle 10/17/2015  . Hx of completed stroke 12/01/2014  . Essential (primary) hypertension 12/01/2014  . GERD without esophagitis 12/01/2014  . Hypertriglyceridemia 12/01/2014  . Degenerative arthritis of finger 12/01/2014  . Urge incontinence of urine 12/01/2014  . Sequelae of cerebral infarction 12/01/2014    Allergies  Allergen Reactions  . Codeine   . Morphine Sulfate   . Shellfish Allergy   . Sulfa Antibiotics Nausea And Vomiting    Past Surgical History:  Procedure Laterality Date  . INTRACAPSULAR CATARACT EXTRACTION Bilateral 2017  . KNEE SURGERY    . TOTAL SHOULDER REPLACEMENT Left   . TUBAL LIGATION    . VARICOSE VEIN SURGERY      Social History   Tobacco Use  . Smoking status: Never Smoker  . Smokeless tobacco: Never Used  . Tobacco comment: smoking cessation materials not required  Substance Use Topics  . Alcohol use: Yes    Alcohol/week: 1.0 standard drinks  Types: 1 Glasses of wine per week    Comment: occasional  . Drug use: No     Medication list has been reviewed and updated.  Current Meds  Medication Sig  . aspirin 81 MG chewable tablet 324 mg.   . Calcium Carb-Cholecalciferol (CALCIUM + D3) 600-200 MG-UNIT TABS Take by mouth.   . docusate sodium (COLACE) 100 MG capsule Take 1 tablet by mouth 2 (two) times daily.  . metoprolol succinate (TOPROL-XL) 50 MG 24 hr tablet Take 1 tablet (50 mg total) by mouth daily. Take with or immediately following a meal.  . Multiple Vitamins-Minerals (MULTIVITAMIN ADULTS 50+ PO) Take by mouth.    PHQ 2/9 Scores 03/08/2018 06/24/2017 06/16/2016 04/01/2016  PHQ - 2 Score 0 0 0 0  PHQ- 9 Score - 0 - -   Wt Readings from Last 3 Encounters:  03/08/18 208 lb 6.4 oz (94.5 kg)  01/28/18 208 lb (94.3 kg)  06/24/17 211 lb 6.4 oz (95.9 kg)    Physical Exam Vitals signs and nursing note reviewed.  Constitutional:      General: She is not in acute distress.    Appearance: She is well-developed.  HENT:     Head: Normocephalic and atraumatic.     Right Ear: Tympanic membrane and ear canal normal.     Left Ear: Tympanic membrane and ear canal normal.     Nose:     Right Sinus: No maxillary sinus tenderness.     Left Sinus: No maxillary sinus tenderness.     Mouth/Throat:     Pharynx: Uvula midline.  Eyes:     General: No scleral icterus.       Right eye: No discharge.        Left eye: No discharge.     Conjunctiva/sclera: Conjunctivae normal.  Neck:     Musculoskeletal: Normal range of motion. No erythema.     Thyroid: No thyromegaly.     Vascular: No carotid bruit.  Cardiovascular:     Rate and Rhythm: Normal rate and regular rhythm.     Pulses: Normal pulses.     Heart sounds: Normal heart sounds.  Pulmonary:     Effort: Pulmonary effort is normal. No respiratory distress.     Breath sounds: No wheezing.  Chest:     Breasts:        Right: No mass, nipple discharge, skin change or tenderness.        Left: No mass, nipple discharge, skin change or tenderness.  Abdominal:     General: Bowel sounds are normal.     Palpations: Abdomen is soft.     Tenderness: There is no abdominal tenderness.  Musculoskeletal:     Right shoulder: She exhibits decreased range of motion,  tenderness and bony tenderness.     Right lower leg: No edema.     Left lower leg: No edema.  Lymphadenopathy:     Cervical: No cervical adenopathy.  Skin:    General: Skin is warm and dry.     Findings: No rash.  Neurological:     Mental Status: She is alert and oriented to person, place, and time.     Cranial Nerves: No cranial nerve deficit.     Sensory: No sensory deficit.     Deep Tendon Reflexes: Reflexes are normal and symmetric.  Psychiatric:        Speech: Speech normal.        Behavior: Behavior normal.  Thought Content: Thought content normal.     BP 118/78   Pulse 72   Ht 5' (1.524 m)   Wt 208 lb 6.4 oz (94.5 kg)   SpO2 96%   BMI 40.70 kg/m   Assessment and Plan: 1. Annual physical exam Normal exam except for weight DEXA scheduled - POCT urinalysis dipstick  2. Encounter for screening mammogram for breast cancer scheduled  3. Essential (primary) hypertension controlled - Comprehensive metabolic panel - metoprolol succinate (TOPROL-XL) 50 MG 24 hr tablet; Take 1 tablet (50 mg total) by mouth daily. Take with or immediately following a meal.  Dispense: 90 tablet; Refill: 3 - TSH  4. GERD without esophagitis Stable on PPI - CBC with Differential/Platelet  5. Hypertriglyceridemia Check labs, work on low fat diet - Lipid panel  6. Primary osteoarthritis of right shoulder Discussed taking tylenol 1000 mg twice a day Follow up with Ortho if worsening or she is ready for surgery  7. Obesity, morbid (HCC) Currently working on weight loss with [employer] Unable to exercise due to severe shoulder pain   Partially dictated using Animal nutritionist. Any errors are unintentional.  Bari Edward, MD [facility]  03/08/2018

## 2018-03-08 NOTE — Patient Instructions (Addendum)
You can take up to 4000 mg of tylenol per day.  This is equivalent to 500 mg tablets - 2 every 6 hours.  Continue with Weight watchers to get your weight back down to a healthier range.

## 2018-03-09 LAB — CBC WITH DIFFERENTIAL/PLATELET
Basophils Absolute: 0.1 10*3/uL (ref 0.0–0.2)
Basos: 1 %
EOS (ABSOLUTE): 0.3 10*3/uL (ref 0.0–0.4)
Eos: 4 %
Hematocrit: 48.8 % — ABNORMAL HIGH (ref 34.0–46.6)
Hemoglobin: 16.8 g/dL — ABNORMAL HIGH (ref 11.1–15.9)
Immature Grans (Abs): 0 10*3/uL (ref 0.0–0.1)
Immature Granulocytes: 0 %
Lymphocytes Absolute: 2.7 10*3/uL (ref 0.7–3.1)
Lymphs: 34 %
MCH: 32.4 pg (ref 26.6–33.0)
MCHC: 34.4 g/dL (ref 31.5–35.7)
MCV: 94 fL (ref 79–97)
Monocytes Absolute: 0.7 10*3/uL (ref 0.1–0.9)
Monocytes: 10 %
NEUTROS ABS: 4.1 10*3/uL (ref 1.4–7.0)
Neutrophils: 51 %
Platelets: 231 10*3/uL (ref 150–450)
RBC: 5.18 x10E6/uL (ref 3.77–5.28)
RDW: 12.1 % (ref 11.7–15.4)
WBC: 7.8 10*3/uL (ref 3.4–10.8)

## 2018-03-09 LAB — COMPREHENSIVE METABOLIC PANEL
A/G RATIO: 1.6 (ref 1.2–2.2)
ALT: 27 IU/L (ref 0–32)
AST: 21 IU/L (ref 0–40)
Albumin: 4.3 g/dL (ref 3.7–4.7)
Alkaline Phosphatase: 67 IU/L (ref 39–117)
BUN/Creatinine Ratio: 16 (ref 12–28)
BUN: 15 mg/dL (ref 8–27)
Bilirubin Total: 0.5 mg/dL (ref 0.0–1.2)
CO2: 22 mmol/L (ref 20–29)
Calcium: 9.4 mg/dL (ref 8.7–10.3)
Chloride: 103 mmol/L (ref 96–106)
Creatinine, Ser: 0.92 mg/dL (ref 0.57–1.00)
GFR calc Af Amer: 69 mL/min/{1.73_m2} (ref >59)
GFR calc non Af Amer: 60 mL/min/{1.73_m2} (ref >59)
GLOBULIN, TOTAL: 2.7 g/dL (ref 1.5–4.5)
Glucose: 96 mg/dL (ref 65–99)
POTASSIUM: 4.6 mmol/L (ref 3.5–5.2)
Sodium: 142 mmol/L (ref 134–144)
Total Protein: 7 g/dL (ref 6.0–8.5)

## 2018-03-09 LAB — LIPID PANEL
Chol/HDL Ratio: 2.6 ratio (ref 0.0–4.4)
Cholesterol, Total: 170 mg/dL (ref 100–199)
HDL: 65 mg/dL (ref >39)
LDL Calculated: 74 mg/dL (ref 0–99)
Triglycerides: 155 mg/dL — ABNORMAL HIGH (ref 0–149)
VLDL CHOLESTEROL CAL: 31 mg/dL (ref 5–40)

## 2018-03-09 LAB — TSH: TSH: 2.28 u[IU]/mL (ref 0.450–4.500)

## 2018-03-13 ENCOUNTER — Other Ambulatory Visit: Payer: Self-pay | Admitting: Internal Medicine

## 2018-03-13 DIAGNOSIS — I10 Essential (primary) hypertension: Secondary | ICD-10-CM

## 2018-03-18 ENCOUNTER — Other Ambulatory Visit: Payer: Self-pay

## 2018-03-18 ENCOUNTER — Ambulatory Visit
Admission: RE | Admit: 2018-03-18 | Discharge: 2018-03-18 | Disposition: A | Discharge status: Home / Self Care | Payer: Medicare Other | Source: Ambulatory Visit | Attending: Internal Medicine | Admitting: Internal Medicine

## 2018-03-18 DIAGNOSIS — Z1231 Encounter for screening mammogram for malignant neoplasm of breast: Secondary | ICD-10-CM | POA: Insufficient documentation

## 2018-03-18 DIAGNOSIS — E2839 Other primary ovarian failure: Secondary | ICD-10-CM | POA: Diagnosis present

## 2018-06-28 ENCOUNTER — Ambulatory Visit: Payer: Medicare Other

## 2018-07-07 HISTORY — PX: REVERSE TOTAL SHOULDER ARTHROPLASTY: SHX2344

## 2018-07-29 ENCOUNTER — Telehealth: Payer: Self-pay | Admitting: Internal Medicine

## 2018-07-29 NOTE — Telephone Encounter (Signed)
Labs reviewed verbally with Barrett Henle, RN at [facility].  Pt is having surgery tomorrow and our fax machine is not transmitting.

## 2018-09-01 ENCOUNTER — Telehealth: Payer: Self-pay

## 2018-09-01 NOTE — Telephone Encounter (Signed)
Please call patient to schedule a [facility] follow up by the end of the year ( non urgent ) to follow up for Peripheral vascular disease, and BP.  Thank you.

## 2018-09-06 ENCOUNTER — Telehealth: Payer: Self-pay | Admitting: Internal Medicine

## 2018-09-06 NOTE — Telephone Encounter (Signed)
°  Called to schedule Medicare Annual Wellness Visit with Nurse Health Advisor, [place]. If patient returns call, please schedule AWV with NHA ~any date  Questions regarding scheduling, please call  [phone] or Skype > [email or web address]   [place]  ??[email or web address]   ??[phone]   1Skype

## 2018-11-01 ENCOUNTER — Other Ambulatory Visit: Payer: Self-pay

## 2018-11-16 NOTE — Telephone Encounter (Signed)
Pt coming in 12/23/18-DS

## 2018-12-23 ENCOUNTER — Other Ambulatory Visit: Payer: Self-pay

## 2018-12-23 ENCOUNTER — Encounter: Payer: Self-pay | Admitting: Internal Medicine

## 2018-12-23 ENCOUNTER — Ambulatory Visit: Payer: Medicare Other | Admitting: Internal Medicine

## 2018-12-23 VITALS — BP 124/78 | HR 71 | Ht 60.0 in | Wt 208.0 lb

## 2018-12-23 DIAGNOSIS — K219 Gastro-esophageal reflux disease without esophagitis: Secondary | ICD-10-CM | POA: Diagnosis not present

## 2018-12-23 DIAGNOSIS — I10 Essential (primary) hypertension: Secondary | ICD-10-CM | POA: Diagnosis not present

## 2018-12-23 DIAGNOSIS — Z1231 Encounter for screening mammogram for malignant neoplasm of breast: Secondary | ICD-10-CM | POA: Diagnosis not present

## 2018-12-23 DIAGNOSIS — M19011 Primary osteoarthritis, right shoulder: Secondary | ICD-10-CM | POA: Diagnosis not present

## 2018-12-23 DIAGNOSIS — Z96659 Presence of unspecified artificial knee joint: Secondary | ICD-10-CM | POA: Insufficient documentation

## 2018-12-23 NOTE — Progress Notes (Signed)
Date:  12/23/2018   Name:  Maria Gibson   DOB:  [birth year]   MRN:  [id number]   Chief Complaint: Hypertension and Gastroesophageal Reflux  Hypertension This is a chronic problem. The problem is controlled. Pertinent negatives include no chest pain, headaches, palpitations or shortness of breath. Past treatments include beta blockers. The current treatment provides significant improvement. There are no compliance problems.   s/p reverse Shoulder - she is doing great with full range of motion and no pain.   Lab Results  Component Value Date   CREATININE 0.92 03/08/2018   BUN 15 03/08/2018   NA 142 03/08/2018   K 4.6 03/08/2018   CL 103 03/08/2018   CO2 22 03/08/2018   Lab Results  Component Value Date   CHOL 170 03/08/2018   HDL 65 03/08/2018   LDLCALC 74 03/08/2018   TRIG 155 (H) 03/08/2018   CHOLHDL 2.6 03/08/2018   Lab Results  Component Value Date   TSH 2.280 03/08/2018   No results found for: HGBA1C   Review of Systems  Constitutional: Negative for chills, fatigue, fever and unexpected weight change.  Respiratory: Negative for cough, chest tightness, shortness of breath and wheezing.   Cardiovascular: Negative for chest pain, palpitations and leg swelling.  Gastrointestinal: Negative for abdominal pain, constipation and diarrhea.  Musculoskeletal: Positive for arthralgias.  Neurological: Negative for dizziness and headaches.  Psychiatric/Behavioral: Negative for sleep disturbance.    Patient Active Problem List   Diagnosis Date Noted  . Artificial knee joint present 12/23/2018  . Obesity, morbid ([place]) 03/08/2018  . Osteoarthritis of right shoulder 03/06/2017  . Arthritis of right knee 04/15/2016  . Blepharoptosis, acquired, left 04/15/2016  . OA (osteoarthritis) of ankle 10/17/2015  . Essential (primary) hypertension 12/01/2014  . GERD without esophagitis 12/01/2014  . Hypertriglyceridemia 12/01/2014  . Degenerative arthritis of finger 12/01/2014   . Urge incontinence of urine 12/01/2014  . Sequelae of cerebral infarction 12/01/2014    Allergies  Allergen Reactions  . Codeine   . Iodine   . Morphine Sulfate   . Shellfish Allergy   . Sulfa Antibiotics Nausea And Vomiting    Past Surgical History:  Procedure Laterality Date  . INTRACAPSULAR CATARACT EXTRACTION Bilateral 2017  . KNEE SURGERY    . REVERSE TOTAL SHOULDER ARTHROPLASTY Right 07/2018  . TOTAL SHOULDER REPLACEMENT Left   . TUBAL LIGATION    . VARICOSE VEIN SURGERY      Social History   Tobacco Use  . Smoking status: Never Smoker  . Smokeless tobacco: Never Used  . Tobacco comment: smoking cessation materials not required  Substance Use Topics  . Alcohol use: Yes    Alcohol/week: 1.0 standard drinks    Types: 1 Glasses of wine per week    Comment: occasional  . Drug use: No     Medication list has been reviewed and updated.  Current Meds  Medication Sig  . aspirin 81 MG chewable tablet 324 mg.   . Calcium Carb-Cholecalciferol (CALCIUM + D3) 600-200 MG-UNIT TABS Take by mouth.  . metoprolol succinate (TOPROL-XL) 50 MG 24 hr tablet TAKE 1 TABLET BY MOUTH DAILY WITH OR IMMEDIATELY FOLLOWING A MEAL  . Multiple Vitamins-Minerals (MULTIVITAMIN ADULTS 50+ PO) Take by mouth.  . Nutritional Supplements (METABOLIC LIVER FORMULA PO) Take by mouth.    PHQ 2/9 Scores 12/23/2018 03/08/2018 06/24/2017 06/16/2016  PHQ - 2 Score 0 0 0 0  PHQ- 9 Score 0 - 0 -    BP Readings  from Last 3 Encounters:  12/23/18 124/78  03/08/18 118/78  01/28/18 114/80    Physical Exam Vitals and nursing note reviewed.  Constitutional:      General: She is not in acute distress.    Appearance: She is well-developed.  HENT:     Head: Normocephalic and atraumatic.  Cardiovascular:     Rate and Rhythm: Normal rate and regular rhythm.     Pulses: Normal pulses.     Heart sounds: Normal heart sounds.  Pulmonary:     Effort: Pulmonary effort is normal. No respiratory distress.   Musculoskeletal:     Right shoulder: No tenderness. Normal range of motion.     Left shoulder: No tenderness. Normal range of motion.     Right lower leg: No edema.     Left lower leg: No edema.  Lymphadenopathy:     Cervical: No cervical adenopathy.  Skin:    General: Skin is warm and dry.     Findings: No rash.  Neurological:     Mental Status: She is alert and oriented to person, place, and time.  Psychiatric:        Behavior: Behavior normal.        Thought Content: Thought content normal.     Wt Readings from Last 3 Encounters:  12/23/18 208 lb (94.3 kg)  03/08/18 208 lb 6.4 oz (94.5 kg)  01/28/18 208 lb (94.3 kg)    BP 124/78   Pulse 71   Ht 5' (1.524 m)   Wt 208 lb (94.3 kg)   SpO2 98%   BMI 40.62 kg/m   Assessment and Plan: 1. Essential (primary) hypertension Clinically stable exam with well controlled BP.   Tolerating medications, metoprolol 50 mg, without side effects at this time. Pt to continue current regimen and low sodium diet; benefits of regular exercise as able discussed.  She recently started [employer] and has lost 8 lbs.  2. Encounter for screening mammogram for breast cancer Pt will schedule at [facility] in March 2021 - MM 3D SCREEN BREAST BILATERAL; Future  3. Primary osteoarthritis of right shoulder She has recovered very well from recent surgery and is not requiring any pain medication   Partially dictated using Animal nutritionist. Any errors are unintentional.  Bari Edward, MD [facility]  12/23/2018

## 2019-03-02 ENCOUNTER — Other Ambulatory Visit: Payer: Self-pay

## 2019-03-02 ENCOUNTER — Ambulatory Visit (INDEPENDENT_AMBULATORY_CARE_PROVIDER_SITE_OTHER): Payer: Medicare PPO

## 2019-03-02 VITALS — BP 132/84 | HR 74 | Resp 16 | Ht 60.0 in | Wt 215.6 lb

## 2019-03-02 DIAGNOSIS — Z0000 Encounter for general adult medical examination without abnormal findings: Secondary | ICD-10-CM | POA: Diagnosis not present

## 2019-03-02 DIAGNOSIS — Z780 Asymptomatic menopausal state: Secondary | ICD-10-CM | POA: Diagnosis not present

## 2019-03-02 NOTE — Patient Instructions (Signed)
Ms. Rayl , Thank you for taking time to come for your Medicare Wellness Visit. I appreciate your ongoing commitment to your health goals. Please review the following plan we discussed and let me know if I can assist you in the future.   Screening recommendations/referrals: Colonoscopy: no longer required Mammogram: done 03/18/18. Scheduled for 03/21/19. Bone Density: done 2011. Please call [phone] to schedule your bone density screening.  Recommended yearly ophthalmology/optometry visit for glaucoma screening and checkup Recommended yearly dental visit for hygiene and checkup  Vaccinations: Influenza vaccine: done 10/27/18 Pneumococcal vaccine: done 03/06/15 Tdap vaccine: DUE - please contact your insurance for coverage information for tetanus or TDAP vaccine Shingles vaccine: Shingrix series completed 2018    Advanced directives: Please bring a copy of your health care power of attorney and living will to the office at your convenience.  Conditions/risks identified: Recommend increasing physical activity.   Next appointment: Please follow up in one year for your Medicare Annual Wellness visit.     Preventive Care 58 Years and Older, Female Preventive care refers to lifestyle choices and visits with your health care provider that can promote health and wellness. What does preventive care include?  A yearly physical exam. This is also called an annual well check.  Dental exams once or twice a year.  Routine eye exams. Ask your health care provider how often you should have your eyes checked.  Personal lifestyle choices, including:  Daily care of your teeth and gums.  Regular physical activity.  Eating a healthy diet.  Avoiding tobacco and drug use.  Limiting alcohol use.  Practicing safe sex.  Taking low-dose aspirin every day.  Taking vitamin and mineral supplements as recommended by your health care provider. What happens during an annual well check? The  services and screenings done by your health care provider during your annual well check will depend on your age, overall health, lifestyle risk factors, and family history of disease. Counseling  Your health care provider may ask you questions about your:  Alcohol use.  Tobacco use.  Drug use.  Emotional well-being.  Home and relationship well-being.  Sexual activity.  Eating habits.  History of falls.  Memory and ability to understand (cognition).  Work and work Astronomer.  Reproductive health. Screening  You may have the following tests or measurements:  Height, weight, and BMI.  Blood pressure.  Lipid and cholesterol levels. These may be checked every 5 years, or more frequently if you are over 17 years old.  Skin check.  Lung cancer screening. You may have this screening every year starting at age 80 if you have a 30-pack-year history of smoking and currently smoke or have quit within the past 15 years.  Fecal occult blood test (FOBT) of the stool. You may have this test every year starting at age 30.  Flexible sigmoidoscopy or colonoscopy. You may have a sigmoidoscopy every 5 years or a colonoscopy every 10 years starting at age 28.  Hepatitis C blood test.  Hepatitis B blood test.  Sexually transmitted disease (STD) testing.  Diabetes screening. This is done by checking your blood sugar (glucose) after you have not eaten for a while (fasting). You may have this done every 1-3 years.  Bone density scan. This is done to screen for osteoporosis. You may have this done starting at age 48.  Mammogram. This may be done every 1-2 years. Talk to your health care provider about how often you should have regular mammograms. Talk with your health  care provider about your test results, treatment options, and if necessary, the need for more tests. Vaccines  Your health care provider may recommend certain vaccines, such as:  Influenza vaccine. This is recommended  every year.  Tetanus, diphtheria, and acellular pertussis (Tdap, Td) vaccine. You may need a Td booster every 10 years.  Zoster vaccine. You may need this after age 1.  Pneumococcal 13-valent conjugate (PCV13) vaccine. One dose is recommended after age 48.  Pneumococcal polysaccharide (PPSV23) vaccine. One dose is recommended after age 21. Talk to your health care provider about which screenings and vaccines you need and how often you need them. This information is not intended to replace advice given to you by your health care provider. Make sure you discuss any questions you have with your health care provider. Document Released: 01/19/2015 Document Revised: 09/12/2015 Document Reviewed: 10/24/2014 Elsevier Interactive Patient Education  2017 [place] Prevention in the Home Falls can cause injuries. They can happen to people of all ages. There are many things you can do to make your home safe and to help prevent falls. What can I do on the outside of my home?  Regularly fix the edges of walkways and driveways and fix any cracks.  Remove anything that might make you trip as you walk through a door, such as a raised step or threshold.  Trim any bushes or trees on the path to your home.  Use bright outdoor lighting.  Clear any walking paths of anything that might make someone trip, such as rocks or tools.  Regularly check to see if handrails are loose or broken. Make sure that both sides of any steps have handrails.  Any raised decks and porches should have guardrails on the edges.  Have any leaves, snow, or ice cleared regularly.  Use sand or salt on walking paths during winter.  Clean up any spills in your garage right away. This includes oil or grease spills. What can I do in the bathroom?  Use night lights.  Install grab bars by the toilet and in the tub and shower. Do not use towel bars as grab bars.  Use non-skid mats or decals in the tub or shower.  If  you need to sit down in the shower, use a plastic, non-slip stool.  Keep the floor dry. Clean up any water that spills on the floor as soon as it happens.  Remove soap buildup in the tub or shower regularly.  Attach bath mats securely with double-sided non-slip rug tape.  Do not have throw rugs and other things on the floor that can make you trip. What can I do in the bedroom?  Use night lights.  Make sure that you have a light by your bed that is easy to reach.  Do not use any sheets or blankets that are too big for your bed. They should not hang down onto the floor.  Have a firm chair that has side arms. You can use this for support while you get dressed.  Do not have throw rugs and other things on the floor that can make you trip. What can I do in the kitchen?  Clean up any spills right away.  Avoid walking on wet floors.  Keep items that you use a lot in easy-to-reach places.  If you need to reach something above you, use a strong step stool that has a grab bar.  Keep electrical cords out of the way.  Do not use floor  polish or wax that makes floors slippery. If you must use wax, use non-skid floor wax.  Do not have throw rugs and other things on the floor that can make you trip. What can I do with my stairs?  Do not leave any items on the stairs.  Make sure that there are handrails on both sides of the stairs and use them. Fix handrails that are broken or loose. Make sure that handrails are as long as the stairways.  Check any carpeting to make sure that it is firmly attached to the stairs. Fix any carpet that is loose or worn.  Avoid having throw rugs at the top or bottom of the stairs. If you do have throw rugs, attach them to the floor with carpet tape.  Make sure that you have a light switch at the top of the stairs and the bottom of the stairs. If you do not have them, ask someone to add them for you. What else can I do to help prevent falls?  Wear shoes  that:  Do not have high heels.  Have rubber bottoms.  Are comfortable and fit you well.  Are closed at the toe. Do not wear sandals.  If you use a stepladder:  Make sure that it is fully opened. Do not climb a closed stepladder.  Make sure that both sides of the stepladder are locked into place.  Ask someone to hold it for you, if possible.  Clearly mark and make sure that you can see:  Any grab bars or handrails.  First and last steps.  Where the edge of each step is.  Use tools that help you move around (mobility aids) if they are needed. These include:  Canes.  Walkers.  Scooters.  Crutches.  Turn on the lights when you go into a dark area. Replace any light bulbs as soon as they burn out.  Set up your furniture so you have a clear path. Avoid moving your furniture around.  If any of your floors are uneven, fix them.  If there are any pets around you, be aware of where they are.  Review your medicines with your doctor. Some medicines can make you feel dizzy. This can increase your chance of falling. Ask your doctor what other things that you can do to help prevent falls. This information is not intended to replace advice given to you by your health care provider. Make sure you discuss any questions you have with your health care provider. Document Released: 10/19/2008 Document Revised: 05/31/2015 Document Reviewed: 01/27/2014 Elsevier Interactive Patient Education  2017 [employer].

## 2019-03-02 NOTE — Progress Notes (Signed)
Subjective:   Maria Gibson is a 79 y.o. female who presents for Medicare Annual (Subsequent) preventive examination.  Review of Systems:   Cardiac Risk Factors include: advanced age (>38men, >109 women);hypertension;obesity (BMI >30kg/m2)     Objective:     Vitals: BP 132/84 (BP Location: Left Arm, Patient Position: Sitting, Cuff Size: Large)   Pulse 74   Resp 16   Ht 5' (1.524 m)   Wt 215 lb 9.6 oz (97.8 kg)   SpO2 97%   BMI 42.11 kg/m   Body mass index is 42.11 kg/m.  Advanced Directives 03/02/2019 06/24/2017 06/16/2016 04/01/2016 03/06/2015  Does Patient Have a Medical Advance Directive? Yes Yes Yes Yes No  Type of Paramedic of [place];Living will [place];Living will [place];Living will [place];Living will -  Copy of [place] in Chart? No - copy requested No - copy requested No - copy requested No - copy requested -  Would patient like information on creating a medical advance directive? - - - - No - patient declined information    Tobacco Social History   Tobacco Use  Smoking Status Never Smoker  Smokeless Tobacco Never Used  Tobacco Comment   smoking cessation materials not required     Counseling given: Not Answered Comment: smoking cessation materials not required   Clinical Intake:  Pre-visit preparation completed: Yes  Pain : No/denies pain     BMI - recorded: 42.11 Nutritional Status: BMI > 30  Obese Nutritional Risks: None Diabetes: No  How often do you need to have someone help you when you read instructions, pamphlets, or other written materials from your doctor or pharmacy?: 1 - Never  Interpreter Needed?: No  Information entered by :: Clemetine Marker LPN  Past Medical History:  Diagnosis Date  . Hypertension    Past Surgical History:  Procedure Laterality Date  . INTRACAPSULAR CATARACT EXTRACTION Bilateral 2017  . KNEE SURGERY     . REVERSE TOTAL SHOULDER ARTHROPLASTY Right 07/2018  . TOTAL SHOULDER REPLACEMENT Left   . TUBAL LIGATION    . VARICOSE VEIN SURGERY     Family History  Problem Relation Age of Onset  . Heart failure Mother   . Diabetes Father   . Heart failure Father   . Breast cancer Neg Hx    Social History   Socioeconomic History  . Marital status: Widowed    Spouse name: Not on file  . Number of children: 2  . Years of education: Not on file  . Highest education level: 12th grade  Occupational History  . Occupation: Retired  Tobacco Use  . Smoking status: Never Smoker  . Smokeless tobacco: Never Used  . Tobacco comment: smoking cessation materials not required  Substance and Sexual Activity  . Alcohol use: Yes    Alcohol/week: 1.0 standard drinks    Types: 1 Glasses of wine per week    Comment: occasional  . Drug use: No  . Sexual activity: Not Currently  Other Topics Concern  . Not on file  Social History Narrative  . Not on file   Social Determinants of Health   Financial Resource Strain: Low Risk   . Difficulty of Paying Living Expenses: Not hard at all  Food Insecurity: No Food Insecurity  . Worried About Charity fundraiser in the Last Year: Never true  . Ran Out of Food in the Last Year: Never true  Transportation Needs:  No Transportation Needs  . Lack of Transportation (Medical): No  . Lack of Transportation (Non-Medical): No  Physical Activity: Inactive  . Days of Exercise per Week: 0 days  . Minutes of Exercise per Session: 0 min  Stress: No Stress Concern Present  . Feeling of Stress : Not at all  Social Connections: Moderately Isolated  . Frequency of Communication with Friends and Family: More than three times a week  . Frequency of Social Gatherings with Friends and Family: Once a week  . Attends Religious Services: Never  . Active Member of Clubs or Organizations: No  . Attends Banker Meetings: Never  . Marital Status: Widowed     Outpatient Encounter Medications as of 03/02/2019  Medication Sig  . aspirin 81 MG chewable tablet 324 mg.   . Calcium Carb-Cholecalciferol (CALCIUM + D3) 600-200 MG-UNIT TABS Take by mouth.  . metoprolol succinate (TOPROL-XL) 50 MG 24 hr tablet TAKE 1 TABLET BY MOUTH DAILY WITH OR IMMEDIATELY FOLLOWING A MEAL  . Multiple Vitamins-Minerals (MULTIVITAMIN ADULTS 50+ PO) Take by mouth.  Marland Kitchen OVER THE COUNTER MEDICATION Lectin shield gundry supplement BID  . OVER THE COUNTER MEDICATION Total restore gundry supplement TID  . OVER THE COUNTER MEDICATION Energy renew powder gundry supplement once daily  . [DISCONTINUED] Nutritional Supplements (METABOLIC LIVER FORMULA PO) Take by mouth.   No facility-administered encounter medications on file as of 03/02/2019.    Activities of Daily Living In your present state of health, do you have any difficulty performing the following activities: 03/02/2019 12/23/2018  Hearing? Y N  Comment declines hearing aids -  Vision? N N  Difficulty concentrating or making decisions? N N  Walking or climbing stairs? N N  Dressing or bathing? N N  Doing errands, shopping? N N  Preparing Food and eating ? N -  Using the Toilet? N -  In the past six months, have you accidently leaked urine? N -  Do you have problems with loss of bowel control? N -  Managing your Medications? N -  Managing your Finances? N -  Housekeeping or managing your Housekeeping? N -  Some recent data might be hidden    Patient Care Team: Reubin Milan, MD as PCP - General (Internal Medicine)    Assessment:   This is a routine wellness examination for Maria Gibson.  Exercise Activities and Dietary recommendations Current Exercise Habits: The patient does not participate in regular exercise at present, Exercise limited by: None identified  Goals    . DIET - INCREASE WATER INTAKE     Recommend to drink at least 6-8 8oz glasses of water per day.    . Increase physical activity        Fall Risk Fall Risk  03/02/2019 12/23/2018 03/08/2018 06/24/2017 06/16/2016  Falls in the past year? 0 0 0 No Yes  Number falls in past yr: 0 0 0 - 1  Injury with Fall? 0 0 0 - No  Risk for fall due to : No Fall Risks - - Impaired vision;Impaired balance/gait;Other (Comment) Impaired balance/gait  Risk for fall due to: Comment - - - wears eyeglasses; arthritis -  Follow up Falls prevention discussed Falls evaluation completed Falls evaluation completed;Falls prevention discussed - Falls prevention discussed  Comment - - - - got railings installed on front porch- will eval for ramp   FALL RISK PREVENTION PERTAINING TO THE HOME:  Any stairs in or around the home? Yes  If so, do they handrails? Yes  Home free of loose throw rugs in walkways, pet beds, electrical cords, etc? Yes  Adequate lighting in your home to reduce risk of falls? Yes   ASSISTIVE DEVICES UTILIZED TO PREVENT FALLS:  Life alert? No  Use of a cane, walker or w/c? No  Grab bars in the bathroom? Yes  Shower chair or bench in shower? No  Elevated toilet seat or a handicapped toilet? No   DME ORDERS:  DME order needed?  No   TIMED UP AND GO:  Was the test performed? Yes .  Length of time to ambulate 10 feet: 5 sec.   GAIT:  Appearance of gait: Gait stead-fast and without the use of an assistive device.   Education: Fall risk prevention has been discussed.  Intervention(s) required? No    Depression Screen PHQ 2/9 Scores 03/02/2019 12/23/2018 03/08/2018 06/24/2017  PHQ - 2 Score 0 0 0 0  PHQ- 9 Score - 0 - 0     Cognitive Function     6CIT Screen 03/02/2019 06/24/2017 06/16/2016 04/01/2016  What Year? 0 points 0 points 0 points 0 points  What month? 0 points 0 points 0 points 0 points  What time? 0 points 0 points 0 points 0 points  Count back from 20 0 points 0 points 0 points 0 points  Months in reverse 0 points 0 points 0 points 0 points  Repeat phrase 0 points 0 points 0 points 0 points  Total Score 0  0 0 0    Immunization History  Administered Date(s) Administered  . Influenza, High Dose Seasonal PF 10/06/2017, 10/27/2018  . Influenza,inj,quad, With Preservative 09/07/2015  . Influenza-Unspecified 10/07/2014, 09/06/2016, 10/27/2018  . PFIZER SARS-COV-2 Vaccination 02/04/2019, 02/28/2019  . Pneumococcal Conjugate-13 11/21/2013  . Pneumococcal Polysaccharide-23 01/07/2006, 03/06/2015  . Pneumococcal-Unspecified 03/06/2015  . Zoster 01/08/2007  . Zoster Recombinat (Shingrix) 04/01/2016, 06/16/2016    Qualifies for Shingles Vaccine? Shingrix series completed 2018  Tdap: Although this vaccine is not a covered service during a Wellness Exam, does the patient still wish to receive this vaccine today?  No .  Education has been provided regarding the importance of this vaccine. Advised may receive this vaccine at local pharmacy or Health Dept. Aware to provide a copy of the vaccination record if obtained from local pharmacy or Health Dept. Verbalized acceptance and understanding.  Flu Vaccine: Up to date  Pneumococcal Vaccine: Up to date    Screening Tests Health Maintenance  Topic Date Due  . TETANUS/TDAP  03/05/2020 (Originally 12/20/1959)  . MAMMOGRAM  03/18/2019  . INFLUENZA VACCINE  Completed  . PNA vac Low Risk Adult  Completed  . DEXA SCAN  Addressed    Cancer Screenings:  Colorectal Screening: No longer required.   Mammogram: Completed 03/18/18. Repeat every year; scheduled for 03/21/19  Bone Density: Completed 2011. Results reflect NORMAL  Repeat every 2 years. Ordered today. Pt provided with contact information and advised to call to schedule appt.   Lung Cancer Screening: (Low Dose CT Chest recommended if Age 78-80 years, 30 pack-year currently smoking OR have quit w/in 15years.) does not qualify. .  Additional Screening:  Hepatitis C Screening: no longer required  Vision Screening: Recommended annual ophthalmology exams for early detection of glaucoma and other  disorders of the eye. Is the patient up to date with their annual eye exam?  Yes  Who is the provider or what is the name of the office in which the pt attends annual eye exams? Dr. Brayton El  Dental Screening:  Recommended annual dental exams for proper oral hygiene  Community Resource Referral:  CRR required this visit?  No       Plan:     I have personally reviewed and addressed the Medicare Annual Wellness questionnaire and have noted the following in the patient's chart:  A. Medical and social history B. Use of alcohol, tobacco or illicit drugs  C. Current medications and supplements D. Functional ability and status E.  Nutritional status F.  Physical activity G. Advance directives H. List of other physicians I.  Hospitalizations, surgeries, and ER visits in previous 12 months J.  Vitals K. Screenings such as hearing and vision if needed, cognitive and depression L. Referrals and appointments   In addition, I have reviewed and discussed with patient certain preventive protocols, quality metrics, and best practice recommendations. A written personalized care plan for preventive services as well as general preventive health recommendations were provided to patient.   Signed,  Reather Littler, LPN Nurse Health Advisor   Nurse Notes: pt doing well and appreciative of visit today

## 2019-03-09 ENCOUNTER — Ambulatory Visit (INDEPENDENT_AMBULATORY_CARE_PROVIDER_SITE_OTHER): Payer: Medicare PPO | Admitting: Internal Medicine

## 2019-03-09 ENCOUNTER — Encounter: Payer: Self-pay | Admitting: Internal Medicine

## 2019-03-09 ENCOUNTER — Other Ambulatory Visit: Payer: Self-pay

## 2019-03-09 VITALS — BP 128/64 | HR 77 | Temp 96.8°F | Ht 60.0 in | Wt 216.0 lb

## 2019-03-09 DIAGNOSIS — E781 Pure hyperglyceridemia: Secondary | ICD-10-CM | POA: Diagnosis not present

## 2019-03-09 DIAGNOSIS — I10 Essential (primary) hypertension: Secondary | ICD-10-CM | POA: Diagnosis not present

## 2019-03-09 DIAGNOSIS — Z0000 Encounter for general adult medical examination without abnormal findings: Secondary | ICD-10-CM

## 2019-03-09 DIAGNOSIS — K219 Gastro-esophageal reflux disease without esophagitis: Secondary | ICD-10-CM

## 2019-03-09 LAB — POCT URINALYSIS DIPSTICK
Bilirubin, UA: NEGATIVE
Blood, UA: NEGATIVE
Glucose, UA: NEGATIVE
Ketones, UA: NEGATIVE
Leukocytes, UA: NEGATIVE
Nitrite, UA: NEGATIVE
Protein, UA: NEGATIVE
Spec Grav, UA: 1.015 (ref 1.010–1.025)
Urobilinogen, UA: 0.2 E.U./dL
pH, UA: 6 (ref 5.0–8.0)

## 2019-03-09 MED ORDER — METOPROLOL SUCCINATE ER 50 MG PO TB24: 50.0000 mg | ORAL_TABLET | Freq: Every day | ORAL | 3 refills | Status: DC

## 2019-03-09 NOTE — Progress Notes (Signed)
Date:  03/09/2019   Name:  Maria Gibson   DOB:  30-Mar-1940   MRN:  [id number]   Chief Complaint: Annual Exam (Breast Exam.) Maria Gibson is a 79 y.o. female who presents today for her Complete Annual Exam. She feels well. She reports exercising with PT. She reports she is sleeping fairly well. She denies breast issues. She feels like she is in excellent health other than the usual complaints that come with age.  Mammogram scheduled DEXA  Scheduled but she wants to cancel Colonoscopy aged out Immunization History  Administered Date(s) Administered  . Influenza, High Dose Seasonal PF 10/06/2017, 10/27/2018  . Influenza,inj,quad, With Preservative 09/07/2015  . Influenza-Unspecified 10/07/2014, 09/06/2016, 10/27/2018  . PFIZER SARS-COV-2 Vaccination 02/04/2019, 02/28/2019  . Pneumococcal Conjugate-13 11/21/2013  . Pneumococcal Polysaccharide-23 01/07/2006, 03/06/2015  . Pneumococcal-Unspecified 03/06/2015  . Zoster 01/08/2007  . Zoster Recombinat (Shingrix) 04/01/2016, 06/16/2016    Hypertension This is a chronic problem. The problem is controlled. Pertinent negatives include no chest pain, headaches, palpitations or shortness of breath. Past treatments include beta blockers. The current treatment provides significant improvement. There are no compliance problems.     Lab Results  Component Value Date   CREATININE 0.92 03/08/2018   BUN 15 03/08/2018   NA 142 03/08/2018   K 4.6 03/08/2018   CL 103 03/08/2018   CO2 22 03/08/2018   Lab Results  Component Value Date   CHOL 170 03/08/2018   HDL 65 03/08/2018   LDLCALC 74 03/08/2018   TRIG 155 (H) 03/08/2018   CHOLHDL 2.6 03/08/2018   Lab Results  Component Value Date   TSH 2.280 03/08/2018   No results found for: HGBA1C   Review of Systems  Constitutional: Negative for chills, fatigue and fever.  HENT: Positive for hearing loss. Negative for congestion, tinnitus, trouble swallowing and voice change.     Eyes: Negative for visual disturbance.  Respiratory: Negative for cough, chest tightness, shortness of breath and wheezing.   Cardiovascular: Negative for chest pain, palpitations and leg swelling.  Gastrointestinal: Negative for abdominal pain, anal bleeding, blood in stool, constipation, diarrhea and vomiting.  Endocrine: Negative for polydipsia and polyuria.  Genitourinary: Negative for dysuria, frequency, genital sores, vaginal bleeding and vaginal discharge.  Musculoskeletal: Positive for arthralgias. Negative for gait problem and joint swelling.  Skin: Negative for color change and rash.  Neurological: Negative for dizziness, tremors, light-headedness and headaches.  Hematological: Negative for adenopathy. Does not bruise/bleed easily.  Psychiatric/Behavioral: Negative for dysphoric mood and sleep disturbance. The patient is not nervous/anxious.     Patient Active Problem List   Diagnosis Date Noted  . Artificial knee joint present 12/23/2018  . Obesity, morbid (HCC) 03/08/2018  . Osteoarthritis of right shoulder 03/06/2017  . Arthritis of right knee 04/15/2016  . Blepharoptosis, acquired, left 04/15/2016  . OA (osteoarthritis) of ankle 10/17/2015  . Essential (primary) hypertension 12/01/2014  . GERD without esophagitis 12/01/2014  . Hypertriglyceridemia 12/01/2014  . Degenerative arthritis of finger 12/01/2014  . Urge incontinence of urine 12/01/2014  . Sequelae of cerebral infarction 12/01/2014    Allergies  Allergen Reactions  . Codeine   . Iodine   . Morphine Sulfate   . Shellfish Allergy   . Sulfa Antibiotics Nausea And Vomiting    Past Surgical History:  Procedure Laterality Date  . INTRACAPSULAR CATARACT EXTRACTION Bilateral 2017  . KNEE SURGERY    . REVERSE TOTAL SHOULDER ARTHROPLASTY Right 07/2018  . TOTAL SHOULDER REPLACEMENT Left   .  TUBAL LIGATION    . VARICOSE VEIN SURGERY      Social History   Tobacco Use  . Smoking status: Never Smoker  .  Smokeless tobacco: Never Used  . Tobacco comment: smoking cessation materials not required  Substance Use Topics  . Alcohol use: Yes    Alcohol/week: 1.0 standard drinks    Types: 1 Glasses of wine per week    Comment: occasional  . Drug use: No    Medication list has been reviewed and updated.  Current Meds  Medication Sig  . aspirin 81 MG chewable tablet 324 mg.   . Calcium Carb-Cholecalciferol (CALCIUM + D3) 600-200 MG-UNIT TABS Take by mouth.  . metoprolol succinate (TOPROL-XL) 50 MG 24 hr tablet TAKE 1 TABLET BY MOUTH DAILY WITH OR IMMEDIATELY FOLLOWING A MEAL  . Multiple Vitamins-Minerals (MULTIVITAMIN ADULTS 50+ PO) Take by mouth.  Marland Kitchen OVER THE COUNTER MEDICATION Lectin shield gundry supplement BID  . OVER THE COUNTER MEDICATION Total restore gundry supplement TID  . OVER THE COUNTER MEDICATION Energy renew powder gundry supplement once daily    PHQ 2/9 Scores 03/02/2019 12/23/2018 03/08/2018 06/24/2017  PHQ - 2 Score 0 0 0 0  PHQ- 9 Score - 0 - 0    BP Readings from Last 3 Encounters:  03/09/19 128/64  03/02/19 132/84  12/23/18 124/78    Physical Exam Vitals and nursing note reviewed.  Constitutional:      General: She is not in acute distress.    Appearance: She is well-developed.  HENT:     Head: Normocephalic and atraumatic.     Right Ear: Tympanic membrane and ear canal normal.     Left Ear: Tympanic membrane and ear canal normal.     Nose:     Right Sinus: No maxillary sinus tenderness.     Left Sinus: No maxillary sinus tenderness.  Eyes:     General: No scleral icterus.       Right eye: No discharge.        Left eye: No discharge.     Conjunctiva/sclera: Conjunctivae normal.  Neck:     Thyroid: No thyromegaly.     Vascular: No carotid bruit.  Cardiovascular:     Rate and Rhythm: Normal rate and regular rhythm.     Pulses: Normal pulses.     Heart sounds: Normal heart sounds.  Pulmonary:     Effort: Pulmonary effort is normal. No respiratory  distress.     Breath sounds: No wheezing.  Abdominal:     General: Bowel sounds are normal.     Palpations: Abdomen is soft.     Tenderness: There is no abdominal tenderness.  Musculoskeletal:        General: Normal range of motion.     Cervical back: Normal range of motion. No erythema.  Lymphadenopathy:     Cervical: No cervical adenopathy.  Skin:    General: Skin is warm and dry.     Findings: Wound present. No rash.     Comments: Excoriations on right thigh from quilting pins occurred on 03/07/19  Neurological:     Mental Status: She is alert and oriented to person, place, and time.     Cranial Nerves: No cranial nerve deficit.     Sensory: No sensory deficit.     Deep Tendon Reflexes: Reflexes are normal and symmetric.  Psychiatric:        Speech: Speech normal.        Behavior: Behavior normal.  Thought Content: Thought content normal.     Wt Readings from Last 3 Encounters:  03/09/19 216 lb (98 kg)  03/02/19 215 lb 9.6 oz (97.8 kg)  12/23/18 208 lb (94.3 kg)    BP 128/64   Pulse 77   Temp (!) 96.8 F (36 C) (Temporal)   Ht 5' (1.524 m)   Wt 216 lb (98 kg)   SpO2 96%   BMI 42.18 kg/m   Assessment and Plan: 1. Annual physical exam Normal exam except for weight Continue to improve diet, exercise as able - POCT urinalysis dipstick  2. Essential (primary) hypertension Clinically stable exam with well controlled BP on metoprolol. Tolerating medications without side effects at this time. Pt to continue current regimen and low sodium diet; benefits of regular exercise as able discussed. - CBC with Differential/Platelet - Comprehensive metabolic panel - TSH  3. Hypertriglyceridemia Low fat diet; mild increase does not need medication at this time - Lipid panel  4. GERD without esophagitis Only occurs with wine or soda; does not occur regularly and not requiring any medication - CBC with Differential/Platelet  5. Obesity, morbid ([place]) Mediterranean  diet recommended   Partially dictated using Editor, commissioning. Any errors are unintentional.  Halina Maidens, MD [facility]  03/09/2019

## 2019-03-09 NOTE — Patient Instructions (Signed)

## 2019-03-10 ENCOUNTER — Other Ambulatory Visit: Payer: Self-pay | Admitting: Internal Medicine

## 2019-03-10 DIAGNOSIS — I10 Essential (primary) hypertension: Secondary | ICD-10-CM

## 2019-03-10 LAB — LIPID PANEL
Chol/HDL Ratio: 2.4 ratio (ref 0.0–4.4)
Cholesterol, Total: 186 mg/dL (ref 100–199)
HDL: 76 mg/dL
LDL Chol Calc (NIH): 83 mg/dL (ref 0–99)
Triglycerides: 162 mg/dL — ABNORMAL HIGH (ref 0–149)
VLDL Cholesterol Cal: 27 mg/dL (ref 5–40)

## 2019-03-10 LAB — COMPREHENSIVE METABOLIC PANEL
ALT: 19 IU/L (ref 0–32)
AST: 19 IU/L (ref 0–40)
Albumin/Globulin Ratio: 1.9 (ref 1.2–2.2)
Albumin: 4.5 g/dL (ref 3.7–4.7)
Alkaline Phosphatase: 78 IU/L (ref 39–117)
BUN/Creatinine Ratio: 19 (ref 12–28)
BUN: 20 mg/dL (ref 8–27)
Bilirubin Total: 0.4 mg/dL (ref 0.0–1.2)
CO2: 26 mmol/L (ref 20–29)
Calcium: 9.5 mg/dL (ref 8.7–10.3)
Chloride: 101 mmol/L (ref 96–106)
Creatinine, Ser: 1.08 mg/dL — ABNORMAL HIGH (ref 0.57–1.00)
GFR calc Af Amer: 56 mL/min/{1.73_m2} — ABNORMAL LOW (ref >59)
GFR calc non Af Amer: 49 mL/min/{1.73_m2} — ABNORMAL LOW (ref >59)
Globulin, Total: 2.4 g/dL (ref 1.5–4.5)
Glucose: 104 mg/dL — ABNORMAL HIGH (ref 65–99)
Potassium: 4.7 mmol/L (ref 3.5–5.2)
Sodium: 143 mmol/L (ref 134–144)
Total Protein: 6.9 g/dL (ref 6.0–8.5)

## 2019-03-10 LAB — TSH: TSH: 4.22 u[IU]/mL (ref 0.450–4.500)

## 2019-03-10 LAB — CBC WITH DIFFERENTIAL/PLATELET
Basophils Absolute: 0.1 x10E3/uL (ref 0.0–0.2)
Basos: 1 %
EOS (ABSOLUTE): 0.3 x10E3/uL (ref 0.0–0.4)
Eos: 3 %
Hematocrit: 48.5 % — ABNORMAL HIGH (ref 34.0–46.6)
Hemoglobin: 16.9 g/dL — ABNORMAL HIGH (ref 11.1–15.9)
Immature Grans (Abs): 0 x10E3/uL (ref 0.0–0.1)
Immature Granulocytes: 0 %
Lymphocytes Absolute: 2.8 x10E3/uL (ref 0.7–3.1)
Lymphs: 34 %
MCH: 32.7 pg (ref 26.6–33.0)
MCHC: 34.8 g/dL (ref 31.5–35.7)
MCV: 94 fL (ref 79–97)
Monocytes Absolute: 0.8 x10E3/uL (ref 0.1–0.9)
Monocytes: 10 %
Neutrophils Absolute: 4.4 x10E3/uL (ref 1.4–7.0)
Neutrophils: 52 %
Platelets: 250 x10E3/uL (ref 150–450)
RBC: 5.17 x10E6/uL (ref 3.77–5.28)
RDW: 12.2 % (ref 11.7–15.4)
WBC: 8.3 x10E3/uL (ref 3.4–10.8)

## 2019-03-21 ENCOUNTER — Other Ambulatory Visit: Payer: Self-pay

## 2019-03-21 ENCOUNTER — Other Ambulatory Visit: Payer: Medicare Other

## 2019-03-21 ENCOUNTER — Ambulatory Visit
Admission: RE | Admit: 2019-03-21 | Discharge: 2019-03-21 | Disposition: A | Discharge status: Home / Self Care | Payer: Medicare PPO | Source: Ambulatory Visit | Attending: Internal Medicine | Admitting: Internal Medicine

## 2019-03-21 ENCOUNTER — Ambulatory Visit: Payer: Medicare Other

## 2019-03-21 DIAGNOSIS — Z1231 Encounter for screening mammogram for malignant neoplasm of breast: Secondary | ICD-10-CM | POA: Insufficient documentation

## 2019-05-25 DIAGNOSIS — I110 Hypertensive heart disease with heart failure: Secondary | ICD-10-CM | POA: Diagnosis not present

## 2019-05-25 DIAGNOSIS — G3184 Mild cognitive impairment, so stated: Secondary | ICD-10-CM | POA: Diagnosis not present

## 2019-05-25 DIAGNOSIS — Z833 Family history of diabetes mellitus: Secondary | ICD-10-CM | POA: Diagnosis not present

## 2019-05-25 DIAGNOSIS — M1990 Unspecified osteoarthritis, unspecified site: Secondary | ICD-10-CM | POA: Diagnosis not present

## 2019-05-25 DIAGNOSIS — F4321 Adjustment disorder with depressed mood: Secondary | ICD-10-CM | POA: Diagnosis not present

## 2019-05-25 DIAGNOSIS — I509 Heart failure, unspecified: Secondary | ICD-10-CM | POA: Diagnosis not present

## 2019-05-25 DIAGNOSIS — R32 Unspecified urinary incontinence: Secondary | ICD-10-CM | POA: Diagnosis not present

## 2019-05-25 DIAGNOSIS — Z7982 Long term (current) use of aspirin: Secondary | ICD-10-CM | POA: Diagnosis not present

## 2019-10-21 ENCOUNTER — Telehealth: Payer: Self-pay

## 2019-10-21 NOTE — Telephone Encounter (Signed)
Copied from CRM [id number]. Topic: General - Other >> Oct 21, 2019  9:26 AM Maria Gibson wrote: Reason for CRM: Patient called ask the doctor to update her records for her Flu shot (High dosage) and her Booster vaccine, which she received at the [facility] on 10/09/19.  No need to call her, she just wants her records updated.

## 2019-10-21 NOTE — Telephone Encounter (Signed)
Chart updated with flu vaccine.  KP

## 2019-12-13 DIAGNOSIS — Z961 Presence of intraocular lens: Secondary | ICD-10-CM | POA: Diagnosis not present

## 2020-02-21 ENCOUNTER — Other Ambulatory Visit: Payer: Self-pay | Admitting: Internal Medicine

## 2020-02-21 DIAGNOSIS — Z1231 Encounter for screening mammogram for malignant neoplasm of breast: Secondary | ICD-10-CM

## 2020-03-05 ENCOUNTER — Other Ambulatory Visit: Payer: Self-pay

## 2020-03-05 ENCOUNTER — Ambulatory Visit (INDEPENDENT_AMBULATORY_CARE_PROVIDER_SITE_OTHER): Payer: Medicare PPO

## 2020-03-05 VITALS — BP 120/78 | HR 78 | Temp 98.2°F | Resp 15 | Ht 60.0 in | Wt 217.4 lb

## 2020-03-05 DIAGNOSIS — Z780 Asymptomatic menopausal state: Secondary | ICD-10-CM

## 2020-03-05 DIAGNOSIS — Z0000 Encounter for general adult medical examination without abnormal findings: Secondary | ICD-10-CM

## 2020-03-05 NOTE — Patient Instructions (Signed)
Ms. Wolfrey , Thank you for taking time to come for your Medicare Wellness Visit. I appreciate your ongoing commitment to your health goals. Please review the following plan we discussed and let me know if I can assist you in the future.   Screening recommendations/referrals: Colonoscopy: no longer required Mammogram: done 03/21/19. Scheduled for 03/22/20 Bone Density: Please call [phone] to schedule your bone density screening.  Recommended yearly ophthalmology/optometry visit for glaucoma screening and checkup Recommended yearly dental visit for hygiene and checkup  Vaccinations: Influenza vaccine: done 10/09/19 Pneumococcal vaccine: done 03/06/15 Tdap vaccine: due Shingles vaccine: done 04/01/16 & 06/16/16   Covid-19:done 02/04/19, 02/28/19 & 10/09/19  Conditions/risks identified: Recommend increasing physical activity as tolerated.   Next appointment: Follow up in one year for your annual wellness visit    Preventive Care 65 Years and Older, Female Preventive care refers to lifestyle choices and visits with your health care provider that can promote health and wellness. What does preventive care include?  A yearly physical exam. This is also called an annual well check.  Dental exams once or twice a year.  Routine eye exams. Ask your health care provider how often you should have your eyes checked.  Personal lifestyle choices, including:  Daily care of your teeth and gums.  Regular physical activity.  Eating a healthy diet.  Avoiding tobacco and drug use.  Limiting alcohol use.  Practicing safe sex.  Taking low-dose aspirin every day.  Taking vitamin and mineral supplements as recommended by your health care provider. What happens during an annual well check? The services and screenings done by your health care provider during your annual well check will depend on your age, overall health, lifestyle risk factors, and family history of disease. Counseling  Your  health care provider may ask you questions about your:  Alcohol use.  Tobacco use.  Drug use.  Emotional well-being.  Home and relationship well-being.  Sexual activity.  Eating habits.  History of falls.  Memory and ability to understand (cognition).  Work and work Astronomer.  Reproductive health. Screening  You may have the following tests or measurements:  Height, weight, and BMI.  Blood pressure.  Lipid and cholesterol levels. These may be checked every 5 years, or more frequently if you are over 66 years old.  Skin check.  Lung cancer screening. You may have this screening every year starting at age 16 if you have a 30-pack-year history of smoking and currently smoke or have quit within the past 15 years.  Fecal occult blood test (FOBT) of the stool. You may have this test every year starting at age 12.  Flexible sigmoidoscopy or colonoscopy. You may have a sigmoidoscopy every 5 years or a colonoscopy every 10 years starting at age 78.  Hepatitis C blood test.  Hepatitis B blood test.  Sexually transmitted disease (STD) testing.  Diabetes screening. This is done by checking your blood sugar (glucose) after you have not eaten for a while (fasting). You may have this done every 1-3 years.  Bone density scan. This is done to screen for osteoporosis. You may have this done starting at age 81.  Mammogram. This may be done every 1-2 years. Talk to your health care provider about how often you should have regular mammograms. Talk with your health care provider about your test results, treatment options, and if necessary, the need for more tests. Vaccines  Your health care provider may recommend certain vaccines, such as:  Influenza vaccine. This is recommended  every year.  Tetanus, diphtheria, and acellular pertussis (Tdap, Td) vaccine. You may need a Td booster every 10 years.  Zoster vaccine. You may need this after age 33.  Pneumococcal 13-valent  conjugate (PCV13) vaccine. One dose is recommended after age 73.  Pneumococcal polysaccharide (PPSV23) vaccine. One dose is recommended after age 70. Talk to your health care provider about which screenings and vaccines you need and how often you need them. This information is not intended to replace advice given to you by your health care provider. Make sure you discuss any questions you have with your health care provider. Document Released: 01/19/2015 Document Revised: 09/12/2015 Document Reviewed: 10/24/2014 Elsevier Interactive Patient Education  2017 [place] Prevention in the Home Falls can cause injuries. They can happen to people of all ages. There are many things you can do to make your home safe and to help prevent falls. What can I do on the outside of my home?  Regularly fix the edges of walkways and driveways and fix any cracks.  Remove anything that might make you trip as you walk through a door, such as a raised step or threshold.  Trim any bushes or trees on the path to your home.  Use bright outdoor lighting.  Clear any walking paths of anything that might make someone trip, such as rocks or tools.  Regularly check to see if handrails are loose or broken. Make sure that both sides of any steps have handrails.  Any raised decks and porches should have guardrails on the edges.  Have any leaves, snow, or ice cleared regularly.  Use sand or salt on walking paths during winter.  Clean up any spills in your garage right away. This includes oil or grease spills. What can I do in the bathroom?  Use night lights.  Install grab bars by the toilet and in the tub and shower. Do not use towel bars as grab bars.  Use non-skid mats or decals in the tub or shower.  If you need to sit down in the shower, use a plastic, non-slip stool.  Keep the floor dry. Clean up any water that spills on the floor as soon as it happens.  Remove soap buildup in the tub or  shower regularly.  Attach bath mats securely with double-sided non-slip rug tape.  Do not have throw rugs and other things on the floor that can make you trip. What can I do in the bedroom?  Use night lights.  Make sure that you have a light by your bed that is easy to reach.  Do not use any sheets or blankets that are too big for your bed. They should not hang down onto the floor.  Have a firm chair that has side arms. You can use this for support while you get dressed.  Do not have throw rugs and other things on the floor that can make you trip. What can I do in the kitchen?  Clean up any spills right away.  Avoid walking on wet floors.  Keep items that you use a lot in easy-to-reach places.  If you need to reach something above you, use a strong step stool that has a grab bar.  Keep electrical cords out of the way.  Do not use floor polish or wax that makes floors slippery. If you must use wax, use non-skid floor wax.  Do not have throw rugs and other things on the floor that can make you trip. What  can I do with my stairs?  Do not leave any items on the stairs.  Make sure that there are handrails on both sides of the stairs and use them. Fix handrails that are broken or loose. Make sure that handrails are as long as the stairways.  Check any carpeting to make sure that it is firmly attached to the stairs. Fix any carpet that is loose or worn.  Avoid having throw rugs at the top or bottom of the stairs. If you do have throw rugs, attach them to the floor with carpet tape.  Make sure that you have a light switch at the top of the stairs and the bottom of the stairs. If you do not have them, ask someone to add them for you. What else can I do to help prevent falls?  Wear shoes that:  Do not have high heels.  Have rubber bottoms.  Are comfortable and fit you well.  Are closed at the toe. Do not wear sandals.  If you use a stepladder:  Make sure that it is fully  opened. Do not climb a closed stepladder.  Make sure that both sides of the stepladder are locked into place.  Ask someone to hold it for you, if possible.  Clearly mark and make sure that you can see:  Any grab bars or handrails.  First and last steps.  Where the edge of each step is.  Use tools that help you move around (mobility aids) if they are needed. These include:  Canes.  Walkers.  Scooters.  Crutches.  Turn on the lights when you go into a dark area. Replace any light bulbs as soon as they burn out.  Set up your furniture so you have a clear path. Avoid moving your furniture around.  If any of your floors are uneven, fix them.  If there are any pets around you, be aware of where they are.  Review your medicines with your doctor. Some medicines can make you feel dizzy. This can increase your chance of falling. Ask your doctor what other things that you can do to help prevent falls. This information is not intended to replace advice given to you by your health care provider. Make sure you discuss any questions you have with your health care provider. Document Released: 10/19/2008 Document Revised: 05/31/2015 Document Reviewed: 01/27/2014 Elsevier Interactive Patient Education  2017 [employer].

## 2020-03-05 NOTE — Progress Notes (Signed)
Subjective:   Maria Gibson is a 80 y.o. female who presents for Medicare Annual (Subsequent) preventive examination.  Review of Systems     Cardiac Risk Factors include: advanced age (>33men, >70 women);hypertension;obesity (BMI >30kg/m2)     Objective:    Today's Vitals   03/05/20 0853  BP: 120/78  Pulse: 78  Resp: 15  Temp: 98.2 F (36.8 C)  TempSrc: Oral  SpO2: 96%  Weight: 217 lb 6.4 oz (98.6 kg)  Height: 5' (1.524 m)   Body mass index is 42.46 kg/m.  Advanced Directives 03/05/2020 03/02/2019 06/24/2017 06/16/2016 04/01/2016 03/06/2015  Does Patient Have a Medical Advance Directive? Yes Yes Yes Yes Yes No  Type of Estate agent of [place];Living will Healthcare Power of [place];Living will Healthcare Power of [place];Living will Healthcare Power of [place];Living will Healthcare Power of [place];Living will -  Copy of Healthcare Power of Attorney in Chart? Yes - validated most recent copy scanned in chart (See row information) No - copy requested No - copy requested No - copy requested No - copy requested -  Would patient like information on creating a medical advance directive? - - - - - No - patient declined information    Current Medications (verified) Outpatient Encounter Medications as of 03/05/2020  Medication Sig  . aspirin 81 MG chewable tablet 324 mg.   . Calcium Carb-Cholecalciferol (CALCIUM + D3) 600-200 MG-UNIT TABS Take by mouth.  . COLLAGEN PO Take by mouth. Collagen protein powder mixed with coffee daily in AM  . metoprolol succinate (TOPROL-XL) 50 MG 24 hr tablet TAKE 1 TABLET BY MOUTH DAILY WITH OR IMMEDIATELY FOLLOWING A MEAL  . Multiple Vitamins-Minerals (MULTIVITAMIN ADULTS 50+ PO) Take by mouth.  Marland Kitchen OVER THE COUNTER MEDICATION Lectin shield gundry supplement BID  . OVER THE COUNTER MEDICATION Bio complete 3 probiotic gundry supply  . OVER THE COUNTER MEDICATION Total restore gundry supplement TID (Patient not taking:  Reported on 03/05/2020)  . OVER THE COUNTER MEDICATION Energy renew powder gundry supplement once daily (Patient not taking: Reported on 03/05/2020)   No facility-administered encounter medications on file as of 03/05/2020.    Allergies (verified) Codeine, Iodine, Morphine sulfate, Shellfish allergy, and Sulfa antibiotics   History: Past Medical History:  Diagnosis Date  . Hypertension    Past Surgical History:  Procedure Laterality Date  . INTRACAPSULAR CATARACT EXTRACTION Bilateral 2017  . KNEE SURGERY    . REVERSE TOTAL SHOULDER ARTHROPLASTY Right 07/2018  . TOTAL SHOULDER REPLACEMENT Left   . TUBAL LIGATION    . VARICOSE VEIN SURGERY     Family History  Problem Relation Age of Onset  . Heart failure Mother   . Diabetes Father   . Heart failure Father   . Breast cancer Neg Hx    Social History   Socioeconomic History  . Marital status: Widowed    Spouse name: Not on file  . Number of children: 2  . Years of education: Not on file  . Highest education level: 12th grade  Occupational History  . Occupation: Retired  Tobacco Use  . Smoking status: Never Smoker  . Smokeless tobacco: Never Used  . Tobacco comment: smoking cessation materials not required  Vaping Use  . Vaping Use: Never used  Substance and Sexual Activity  . Alcohol use: Yes    Alcohol/week: 1.0 standard drink    Types: 1 Glasses of wine per week    Comment: occasional  . Drug use: No  . Sexual activity:  Not Currently  Other Topics Concern  . Not on file  Social History Narrative   Pt lives alone   Social Determinants of Health   Financial Resource Strain: Low Risk   . Difficulty of Paying Living Expenses: Not hard at all  Food Insecurity: No Food Insecurity  . Worried About Programme researcher, broadcasting/film/video in the Last Year: Never true  . Ran Out of Food in the Last Year: Never true  Transportation Needs: No Transportation Needs  . Lack of Transportation (Medical): No  . Lack of Transportation  (Non-Medical): No  Physical Activity: Inactive  . Days of Exercise per Week: 0 days  . Minutes of Exercise per Session: 0 min  Stress: No Stress Concern Present  . Feeling of Stress : Only a little  Social Connections: Socially Isolated  . Frequency of Communication with Friends and Family: More than three times a week  . Frequency of Social Gatherings with Friends and Family: Once a week  . Attends Religious Services: Never  . Active Member of Clubs or Organizations: No  . Attends Banker Meetings: Never  . Marital Status: Widowed    Tobacco Counseling Counseling given: Not Answered Comment: smoking cessation materials not required   Clinical Intake:  Pre-visit preparation completed: Yes  Pain : No/denies pain     BMI - recorded: 42.46 Nutritional Status: BMI > 30  Obese Nutritional Risks: None Diabetes: No  How often do you need to have someone help you when you read instructions, pamphlets, or other written materials from your doctor or pharmacy?: 1 - Never    Interpreter Needed?: No  Information entered by :: Reather Littler LPN   Activities of Daily Living In your present state of health, do you have any difficulty performing the following activities: 03/05/2020  Hearing? Y  Comment declines hearing aids  Vision? N  Difficulty concentrating or making decisions? N  Walking or climbing stairs? Y  Dressing or bathing? N  Doing errands, shopping? N  Preparing Food and eating ? N  Using the Toilet? N  In the past six months, have you accidently leaked urine? N  Do you have problems with loss of bowel control? N  Managing your Medications? N  Managing your Finances? N  Housekeeping or managing your Housekeeping? N  Some recent data might be hidden    Patient Care Team: Reubin Milan, MD as PCP - General (Internal Medicine) Vernie Murders, MD (Otolaryngology)  Indicate any recent Medical Services you may have received from other than [facility]  providers in the past year (date may be approximate).     Assessment:   This is a routine wellness examination for Maria Gibson.  Hearing/Vision screen  Hearing Screening   125Hz  250Hz  500Hz  1000Hz  2000Hz  3000Hz  4000Hz  6000Hz  8000Hz   Right ear:           Left ear:           Comments: Pt qualifies for hearing aids but declines due to cost  Vision Screening Comments: Sees Dr. for annual eye exams in [facility]  Dietary issues and exercise activities discussed: Current Exercise Habits: The patient does not participate in regular exercise at present, Exercise limited by: orthopedic condition(s)  Goals    . DIET - INCREASE WATER INTAKE     Recommend to drink at least 6-8 8oz glasses of water per day.    . Increase physical activity      Depression Screen [facility] 2/9 Scores 03/05/2020 03/02/2019 12/23/2018  03/08/2018 06/24/2017 06/16/2016 04/01/2016  PHQ - 2 Score 0 0 0 0 0 0 0  PHQ- 9 Score - - 0 - 0 - -    Fall Risk Fall Risk  03/05/2020 03/02/2019 12/23/2018 03/08/2018 06/24/2017  Falls in the past year? 0 0 0 0 No  Number falls in past yr: 0 0 0 0 -  Injury with Fall? 0 0 0 0 -  Risk for fall due to : No Fall Risks No Fall Risks - - Impaired vision;Impaired balance/gait;Other (Comment)  Risk for fall due to: Comment - - - - wears eyeglasses; arthritis  Follow up Falls prevention discussed Falls prevention discussed Falls evaluation completed Falls evaluation completed;Falls prevention discussed -  Comment - - - - -    FALL RISK PREVENTION PERTAINING TO THE HOME:  Any stairs in or around the home? Yes  If so, are there any without handrails? No  Home free of loose throw rugs in walkways, pet beds, electrical cords, etc? Yes  Adequate lighting in your home to reduce risk of falls? Yes   ASSISTIVE DEVICES UTILIZED TO PREVENT FALLS:  Life alert? No  Use of a cane, walker or w/c? No  Grab bars in the bathroom? Yes  Shower chair or bench in shower? No  Elevated toilet seat or a  handicapped toilet? No   TIMED UP AND GO:  Was the test performed? Yes .  Length of time to ambulate 10 feet: 6 sec.   Gait steady and fast without use of assistive device  Cognitive Function: Normal cognitive status assessed by direct observation by this Nurse Health Advisor. No abnormalities found.       6CIT Screen 03/02/2019 06/24/2017 06/16/2016 04/01/2016  What Year? 0 points 0 points 0 points 0 points  What month? 0 points 0 points 0 points 0 points  What time? 0 points 0 points 0 points 0 points  Count back from 20 0 points 0 points 0 points 0 points  Months in reverse 0 points 0 points 0 points 0 points  Repeat phrase 0 points 0 points 0 points 0 points  Total Score 0 0 0 0    Immunizations Immunization History  Administered Date(s) Administered  . Fluad Quad(high Dose 65+) 10/09/2019  . Influenza, High Dose Seasonal PF 10/06/2017, 10/27/2018  . Influenza,inj,quad, With Preservative 09/07/2015  . Influenza-Unspecified 10/07/2014, 09/06/2016, 10/27/2018  . PFIZER(Purple Top)SARS-COV-2 Vaccination 02/04/2019, 02/28/2019, 10/09/2019  . Pneumococcal Conjugate-13 11/21/2013  . Pneumococcal Polysaccharide-23 01/07/2006, 03/06/2015  . Pneumococcal-Unspecified 03/06/2015  . Zoster 01/08/2007  . Zoster Recombinat (Shingrix) 04/01/2016, 06/16/2016    TDAP status: Due, Education has been provided regarding the importance of this vaccine. Advised may receive this vaccine at local pharmacy or Health Dept. Aware to provide a copy of the vaccination record if obtained from local pharmacy or Health Dept. Verbalized acceptance and understanding.  Flu Vaccine status: Up to date  Pneumococcal vaccine status: Up to date  Covid-19 vaccine status: Completed vaccines  Qualifies for Shingles Vaccine? Yes   Zostavax completed Yes   Shingrix Completed?: Yes  Screening Tests Health Maintenance  Topic Date Due  . TETANUS/TDAP  03/05/2020 (Originally 12/20/1959)  . MAMMOGRAM  03/20/2020   . INFLUENZA VACCINE  Completed  . COVID-19 Vaccine  Completed  . PNA vac Low Risk Adult  Completed  . DEXA SCAN  Addressed    Health Maintenance  There are no preventive care reminders to display for this patient.  Colorectal cancer screening: No longer required.  Mammogram status: Completed 03/21/19. Repeat every year. Scheduled for 03/22/20.  Bone Density status: Completed 2011. Results reflect: Bone density results: NORMAL. Repeat every 2 years. ordered today.  Lung Cancer Screening: (Low Dose CT Chest recommended if Age 26-80 years, 30 pack-year currently smoking OR have quit w/in 15years.) does not qualify.   Additional Screening:  Hepatitis C Screening: does not qualify.   Vision Screening: Recommended annual ophthalmology exams for early detection of glaucoma and other disorders of the eye. Is the patient up to date with their annual eye exam?  Yes  Who is the provider or what is the name of the office in which the patient attends annual eye exams? Dr. Leonard SchwartzScroggs   Dental Screening: Recommended annual dental exams for proper oral hygiene  Community Resource Referral / Chronic Care Management: CRR required this visit?  No   CCM required this visit?  No      Plan:     I have personally reviewed and noted the following in the patient's chart:   . Medical and social history . Use of alcohol, tobacco or illicit drugs  . Current medications and supplements . Functional ability and status . Nutritional status . Physical activity . Advanced directives . List of other physicians . Hospitalizations, surgeries, and ER visits in previous 12 months . Vitals . Screenings to include cognitive, depression, and falls . Referrals and appointments  In addition, I have reviewed and discussed with patient certain preventive protocols, quality metrics, and best practice recommendations. A written personalized care plan for preventive services as well as general preventive health  recommendations were provided to patient.     Reather LittlerKasey Uthus, LPN   1/61/09602/28/2022    Nurse Notes: pt c/o fatigue and decreased energy; taking supplements from [employer]. Scheduled for follow up CPE next with Dr. Judithann GravesBerglund and due for labs.   Pt also states she [facility] stating they will no longer cover metoprolol ER 50 mg and may required prior authorization. Letter did not state why they are not covering it other than a formulary issue. Pt to discuss at [facility] next week.

## 2020-03-13 ENCOUNTER — Ambulatory Visit (INDEPENDENT_AMBULATORY_CARE_PROVIDER_SITE_OTHER): Payer: Medicare PPO | Admitting: Internal Medicine

## 2020-03-13 ENCOUNTER — Encounter: Payer: Self-pay | Admitting: Internal Medicine

## 2020-03-13 ENCOUNTER — Other Ambulatory Visit: Payer: Self-pay

## 2020-03-13 VITALS — BP 134/82 | HR 73 | Ht 60.0 in | Wt 216.0 lb

## 2020-03-13 DIAGNOSIS — E781 Pure hyperglyceridemia: Secondary | ICD-10-CM | POA: Diagnosis not present

## 2020-03-13 DIAGNOSIS — Z1231 Encounter for screening mammogram for malignant neoplasm of breast: Secondary | ICD-10-CM | POA: Diagnosis not present

## 2020-03-13 DIAGNOSIS — N3941 Urge incontinence: Secondary | ICD-10-CM

## 2020-03-13 DIAGNOSIS — Z0000 Encounter for general adult medical examination without abnormal findings: Secondary | ICD-10-CM | POA: Diagnosis not present

## 2020-03-13 DIAGNOSIS — I10 Essential (primary) hypertension: Secondary | ICD-10-CM

## 2020-03-13 MED ORDER — LISINOPRIL 10 MG PO TABS: 10.0000 mg | ORAL_TABLET | Freq: Every day | ORAL | 1 refills | Status: DC

## 2020-03-13 MED ORDER — OXYBUTYNIN CHLORIDE ER 5 MG PO TB24: 5.0000 mg | ORAL_TABLET | Freq: Every day | ORAL | 2 refills | Status: DC

## 2020-03-13 NOTE — Patient Instructions (Addendum)
Start vitamin B12 or a B complex (highest mg you can find) and take one a day.

## 2020-03-13 NOTE — Progress Notes (Signed)
Date:  03/13/2020   Name:  Maria Gibson   DOB:  November 13, 1940   MRN:  [id number]   Chief Complaint: Annual Exam (Breast Exam. NO pap- aged out.)  Maria Gibson is a 80 y.o. female who presents today for her Complete Annual Exam. She feels well. She reports exercising - not much at this time. She reports she is sleeping poorly. Breast complaints - none.  Mammogram: 03/2019 - scheduled DEXA: scheduled Colonoscopy: aged out  Immunization History  Administered Date(s) Administered  . Fluad Quad(high Dose 65+) 10/09/2019  . Influenza, High Dose Seasonal PF 10/06/2017, 10/27/2018  . Influenza,inj,quad, With Preservative 09/07/2015  . Influenza-Unspecified 10/07/2014, 09/06/2016, 10/27/2018  . PFIZER(Purple Top)SARS-COV-2 Vaccination 02/04/2019, 02/28/2019, 10/09/2019  . Pneumococcal Conjugate-13 11/21/2013  . Pneumococcal Polysaccharide-23 01/07/2006, 03/06/2015  . Pneumococcal-Unspecified 03/06/2015  . Zoster 01/08/2007  . Zoster Recombinat (Shingrix) 04/01/2016, 06/16/2016    Hypertension This is a chronic problem. The problem is controlled. Pertinent negatives include no chest pain, headaches, palpitations or shortness of breath. Past treatments include beta blockers. The current treatment provides significant improvement. Compliance problems include exercise.    OAB - feels tired from interrupted sleep having to get up to urinate all through the night. She would like to try medication.    Lab Results  Component Value Date   CREATININE 1.08 (H) 03/09/2019   BUN 20 03/09/2019   NA 143 03/09/2019   K 4.7 03/09/2019   CL 101 03/09/2019   CO2 26 03/09/2019   Lab Results  Component Value Date   CHOL 186 03/09/2019   HDL 76 03/09/2019   LDLCALC 83 03/09/2019   TRIG 162 (H) 03/09/2019   CHOLHDL 2.4 03/09/2019   Lab Results  Component Value Date   TSH 4.220 03/09/2019   No results found for: HGBA1C Lab Results  Component Value Date   WBC 8.3 03/09/2019    HGB 16.9 (H) 03/09/2019   HCT 48.5 (H) 03/09/2019   MCV 94 03/09/2019   PLT 250 03/09/2019   Lab Results  Component Value Date   ALT 19 03/09/2019   AST 19 03/09/2019   ALKPHOS 78 03/09/2019   BILITOT 0.4 03/09/2019     Review of Systems  Constitutional: Positive for fatigue. Negative for chills and fever.  HENT: Negative for congestion, hearing loss, tinnitus, trouble swallowing and voice change.   Eyes: Negative for visual disturbance.  Respiratory: Negative for cough, chest tightness, shortness of breath and wheezing.   Cardiovascular: Negative for chest pain, palpitations and leg swelling.  Gastrointestinal: Negative for abdominal pain, constipation, diarrhea and vomiting.  Endocrine: Negative for polydipsia and polyuria.  Genitourinary: Negative for dysuria, frequency, genital sores, vaginal bleeding and vaginal discharge.  Musculoskeletal: Positive for arthralgias and gait problem. Negative for joint swelling.  Skin: Negative for color change and rash.  Neurological: Negative for dizziness, tremors, light-headedness and headaches.  Hematological: Negative for adenopathy. Does not bruise/bleed easily.  Psychiatric/Behavioral: Positive for sleep disturbance. Negative for dysphoric mood. The patient is not nervous/anxious.     Patient Active Problem List   Diagnosis Date Noted  . Artificial knee joint present 12/23/2018  . Obesity, morbid (HCC) 03/08/2018  . Osteoarthritis of right shoulder 03/06/2017  . Arthritis of right knee 04/15/2016  . Blepharoptosis, acquired, left 04/15/2016  . OA (osteoarthritis) of ankle 10/17/2015  . Essential (primary) hypertension 12/01/2014  . GERD without esophagitis 12/01/2014  . Hypertriglyceridemia 12/01/2014  . Degenerative arthritis of finger 12/01/2014  . Urge incontinence of urine 12/01/2014  .  Sequelae of cerebral infarction 12/01/2014    Allergies  Allergen Reactions  . Codeine   . Iodine   . Morphine Sulfate   . Shellfish  Allergy   . Sulfa Antibiotics Nausea And Vomiting    Past Surgical History:  Procedure Laterality Date  . INTRACAPSULAR CATARACT EXTRACTION Bilateral 2017  . KNEE SURGERY    . REVERSE TOTAL SHOULDER ARTHROPLASTY Right 07/2018  . TOTAL SHOULDER REPLACEMENT Left   . TUBAL LIGATION    . VARICOSE VEIN SURGERY      Social History   Tobacco Use  . Smoking status: Never Smoker  . Smokeless tobacco: Never Used  . Tobacco comment: smoking cessation materials not required  Vaping Use  . Vaping Use: Never used  Substance Use Topics  . Alcohol use: Yes    Alcohol/week: 1.0 standard drink    Types: 1 Glasses of wine per week    Comment: occasional  . Drug use: No     Medication list has been reviewed and updated.  Current Meds  Medication Sig  . aspirin 81 MG chewable tablet 324 mg.   . Calcium Carb-Cholecalciferol (CALCIUM + D3) 600-200 MG-UNIT TABS Take by mouth.  . COLLAGEN PO Take by mouth. Collagen protein powder mixed with coffee daily in AM  . metoprolol succinate (TOPROL-XL) 50 MG 24 hr tablet TAKE 1 TABLET BY MOUTH DAILY WITH OR IMMEDIATELY FOLLOWING A MEAL  . Multiple Vitamins-Minerals (MULTIVITAMIN ADULTS 50+ PO) Take by mouth.  Marland Kitchen OVER THE COUNTER MEDICATION Lectin shield gundry supplement BID  . OVER THE COUNTER MEDICATION Bio complete 3 probiotic gundry supply    PHQ 2/9 Scores 03/13/2020 03/05/2020 03/02/2019 12/23/2018  PHQ - 2 Score 0 0 0 0  PHQ- 9 Score 0 - - 0    GAD 7 : Generalized Anxiety Score 03/13/2020  Nervous, Anxious, on Edge 0  Control/stop worrying 0  Worry too much - different things 0  Trouble relaxing 0  Restless 0  Easily annoyed or irritable 0  Afraid - awful might happen 0  Total GAD 7 Score 0  Anxiety Difficulty Not difficult at all    BP Readings from Last 3 Encounters:  03/13/20 134/82  03/05/20 120/78  03/09/19 128/64    Physical Exam Vitals and nursing note reviewed.  Constitutional:      General: She is not in acute  distress.    Appearance: She is well-developed. She is obese.  HENT:     Head: Normocephalic and atraumatic.     Right Ear: Tympanic membrane and ear canal normal.     Left Ear: Tympanic membrane and ear canal normal.     Nose:     Right Sinus: No maxillary sinus tenderness.     Left Sinus: No maxillary sinus tenderness.  Eyes:     General: No scleral icterus.       Right eye: No discharge.        Left eye: No discharge.     Conjunctiva/sclera: Conjunctivae normal.  Neck:     Thyroid: No thyromegaly.     Vascular: No carotid bruit.  Cardiovascular:     Rate and Rhythm: Normal rate and regular rhythm.     Pulses: Normal pulses.     Heart sounds: Normal heart sounds.  Pulmonary:     Effort: Pulmonary effort is normal. No respiratory distress.     Breath sounds: No wheezing.  Chest:  Breasts:     Right: No mass, nipple discharge, skin change or tenderness.  Left: No mass, nipple discharge, skin change or tenderness.    Abdominal:     General: Bowel sounds are normal.     Palpations: Abdomen is soft.     Tenderness: There is no abdominal tenderness. There is no guarding or rebound.  Musculoskeletal:     Cervical back: Normal range of motion. No erythema.     Right lower leg: No edema.     Left lower leg: No edema.  Lymphadenopathy:     Cervical: No cervical adenopathy.  Skin:    General: Skin is warm and dry.     Findings: No rash.  Neurological:     General: No focal deficit present.     Mental Status: She is alert and oriented to person, place, and time.     Cranial Nerves: No cranial nerve deficit.     Sensory: No sensory deficit.     Deep Tendon Reflexes: Reflexes are normal and symmetric.  Psychiatric:        Attention and Perception: Attention normal.        Mood and Affect: Mood normal.     Wt Readings from Last 3 Encounters:  03/13/20 216 lb (98 kg)  03/05/20 217 lb 6.4 oz (98.6 kg)  03/09/19 216 lb (98 kg)    BP 134/82   Pulse 73   Ht 5' (1.524  m)   Wt 216 lb (98 kg)   SpO2 95%   BMI 42.18 kg/m   Assessment and Plan: 1. Annual physical exam Exam is normal except for weight. Encourage regular exercise and appropriate dietary changes.  2. Encounter for screening mammogram for breast cancer Scheduled along with DEXA  3. Essential (primary) hypertension Clinically stable exam with well controlled BP on toprol XL but this is no longer covered. Pt to continue low sodium diet; benefits of regular exercise as able discussed. Will discontinue toprol after current 90 supply is out then begin lisinopril 10 mg Follow up in 6 months for BP recheck - CBC with Differential/Platelet - Comprehensive metabolic panel - TSH - lisinopril (ZESTRIL) 10 MG tablet; Take 1 tablet (10 mg total) by mouth daily.  Dispense: 90 tablet; Refill: 1  4. Hypertriglyceridemia Work on diet changes; medication not indicated at this time - Lipid panel  5. Obesity, morbid (HCC) Would benefit from diet changes and exercise Limited by energy level and OA Recommend B12 supplement and activity as tolerated  6. Urge incontinence of urine Trial of ditropan at HS - oxybutynin (DITROPAN XL) 5 MG 24 hr tablet; Take 1 tablet (5 mg total) by mouth at bedtime.  Dispense: 30 tablet; Refill: 2   Partially dictated using Animal nutritionist. Any errors are unintentional.  Bari Edward, MD [facility]  03/13/2020

## 2020-03-14 LAB — LIPID PANEL
Chol/HDL Ratio: 2.6 ratio (ref 0.0–4.4)
Cholesterol, Total: 190 mg/dL (ref 100–199)
HDL: 74 mg/dL (ref >39)
LDL Chol Calc (NIH): 90 mg/dL (ref 0–99)
Triglycerides: 155 mg/dL — ABNORMAL HIGH (ref 0–149)
VLDL Cholesterol Cal: 26 mg/dL (ref 5–40)

## 2020-03-14 LAB — COMPREHENSIVE METABOLIC PANEL
ALT: 21 IU/L (ref 0–32)
AST: 21 IU/L (ref 0–40)
Albumin/Globulin Ratio: 1.6 (ref 1.2–2.2)
Albumin: 4.4 g/dL (ref 3.7–4.7)
Alkaline Phosphatase: 62 IU/L (ref 44–121)
BUN/Creatinine Ratio: 15 (ref 12–28)
BUN: 16 mg/dL (ref 8–27)
Bilirubin Total: 0.5 mg/dL (ref 0.0–1.2)
CO2: 25 mmol/L (ref 20–29)
Calcium: 9.4 mg/dL (ref 8.7–10.3)
Chloride: 100 mmol/L (ref 96–106)
Creatinine, Ser: 1.09 mg/dL — ABNORMAL HIGH (ref 0.57–1.00)
Globulin, Total: 2.7 g/dL (ref 1.5–4.5)
Glucose: 97 mg/dL (ref 65–99)
Potassium: 4.6 mmol/L (ref 3.5–5.2)
Sodium: 140 mmol/L (ref 134–144)
Total Protein: 7.1 g/dL (ref 6.0–8.5)
eGFR: 51 mL/min/{1.73_m2} — ABNORMAL LOW (ref >59)

## 2020-03-14 LAB — CBC WITH DIFFERENTIAL/PLATELET
Basophils Absolute: 0.1 10*3/uL (ref 0.0–0.2)
Basos: 1 %
EOS (ABSOLUTE): 0.2 10*3/uL (ref 0.0–0.4)
Eos: 2 %
Hematocrit: 48.6 % — ABNORMAL HIGH (ref 34.0–46.6)
Hemoglobin: 16.6 g/dL — ABNORMAL HIGH (ref 11.1–15.9)
Immature Grans (Abs): 0 10*3/uL (ref 0.0–0.1)
Immature Granulocytes: 0 %
Lymphocytes Absolute: 2.5 10*3/uL (ref 0.7–3.1)
Lymphs: 34 %
MCH: 32.4 pg (ref 26.6–33.0)
MCHC: 34.2 g/dL (ref 31.5–35.7)
MCV: 95 fL (ref 79–97)
Monocytes Absolute: 0.7 10*3/uL (ref 0.1–0.9)
Monocytes: 10 %
Neutrophils Absolute: 3.8 10*3/uL (ref 1.4–7.0)
Neutrophils: 53 %
Platelets: 225 10*3/uL (ref 150–450)
RBC: 5.12 x10E6/uL (ref 3.77–5.28)
RDW: 12 % (ref 11.7–15.4)
WBC: 7.2 10*3/uL (ref 3.4–10.8)

## 2020-03-14 LAB — TSH: TSH: 2.58 u[IU]/mL (ref 0.450–4.500)

## 2020-03-15 IMAGING — MG DIGITAL SCREENING BILAT W/ TOMO W/ CAD
8 series · 8 of 24 positions shown · non-contrast
Comparison: Previous exam(s).

CLINICAL DATA: Screening.

EXAM:
DIGITAL SCREENING BILATERAL MAMMOGRAM WITH TOMO AND CAD

[L MLO synth-2D]
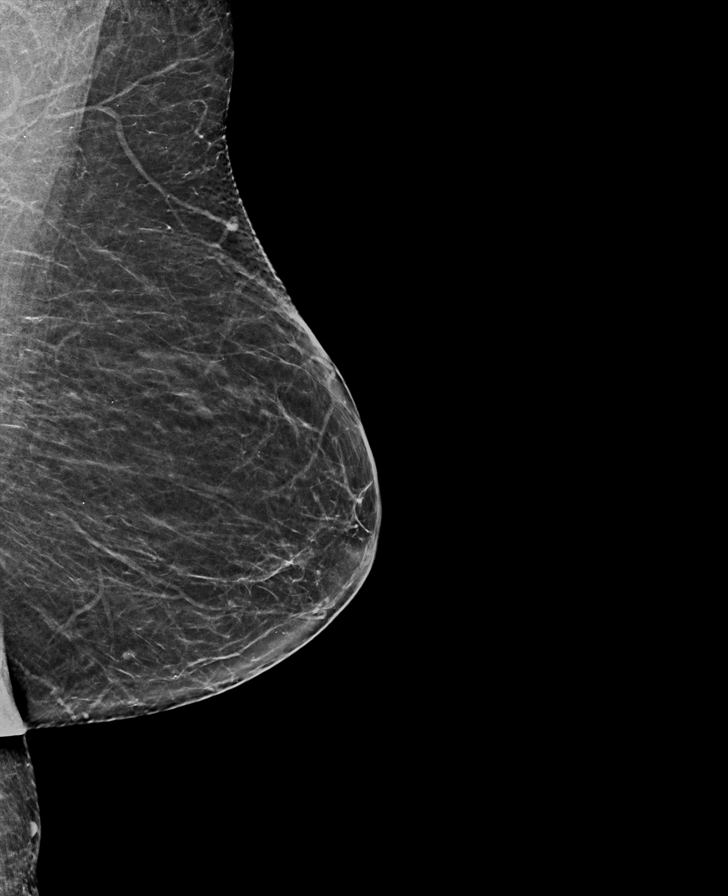

[L CC synth-2D]
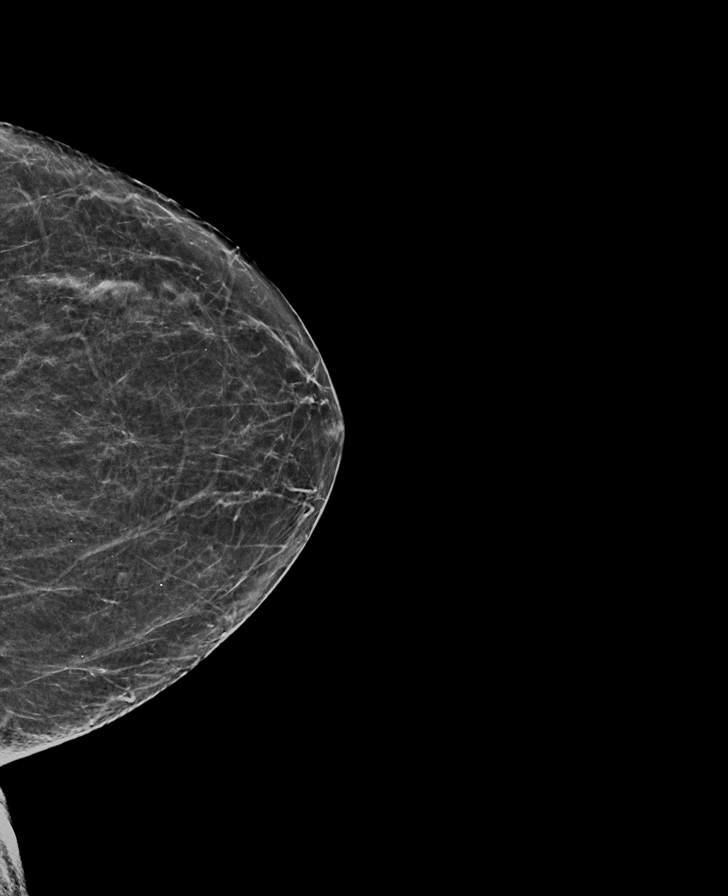

[R CC synth-2D]
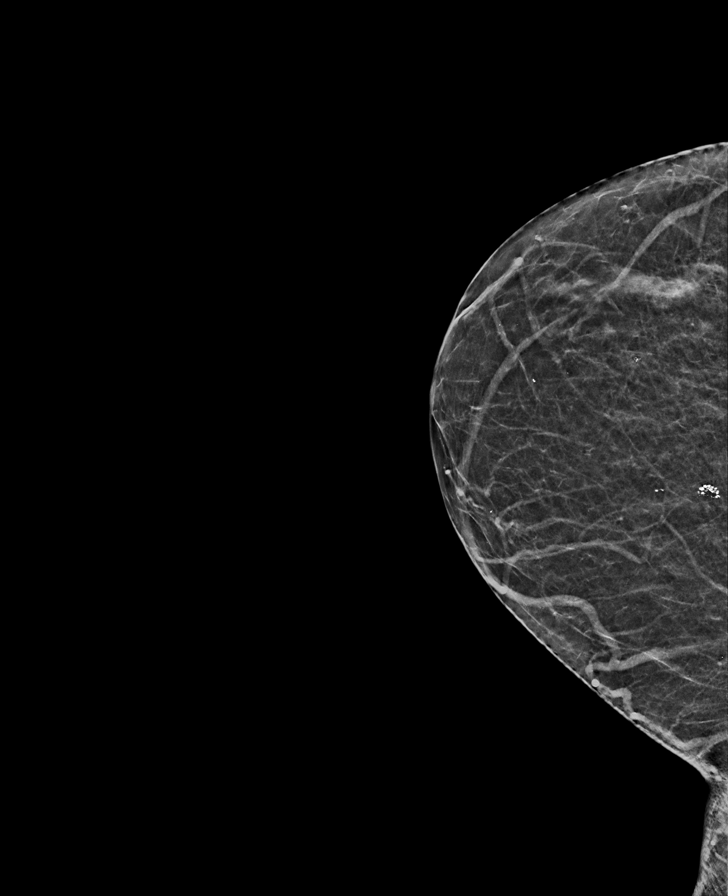

[R MLO synth-2D]
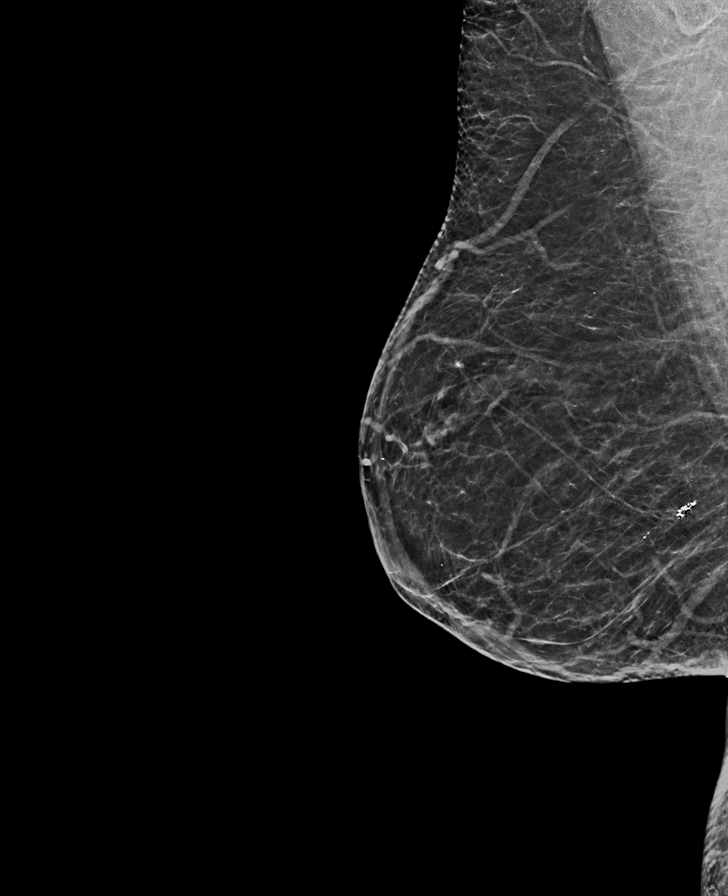

[L CC tomo · tomo slice 27/53.0]
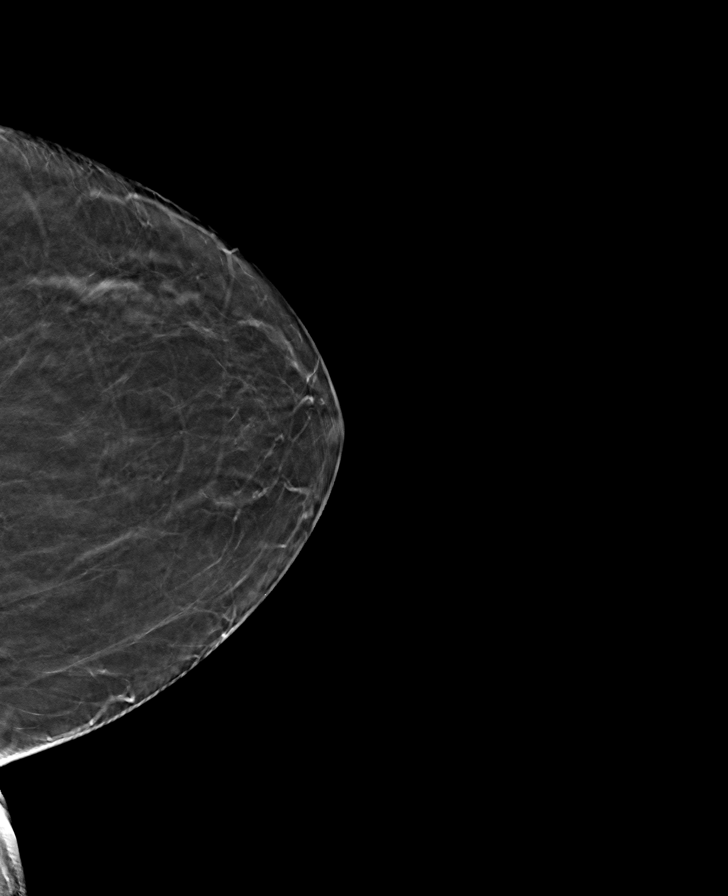

[R MLO tomo · tomo slice 31/60.0]
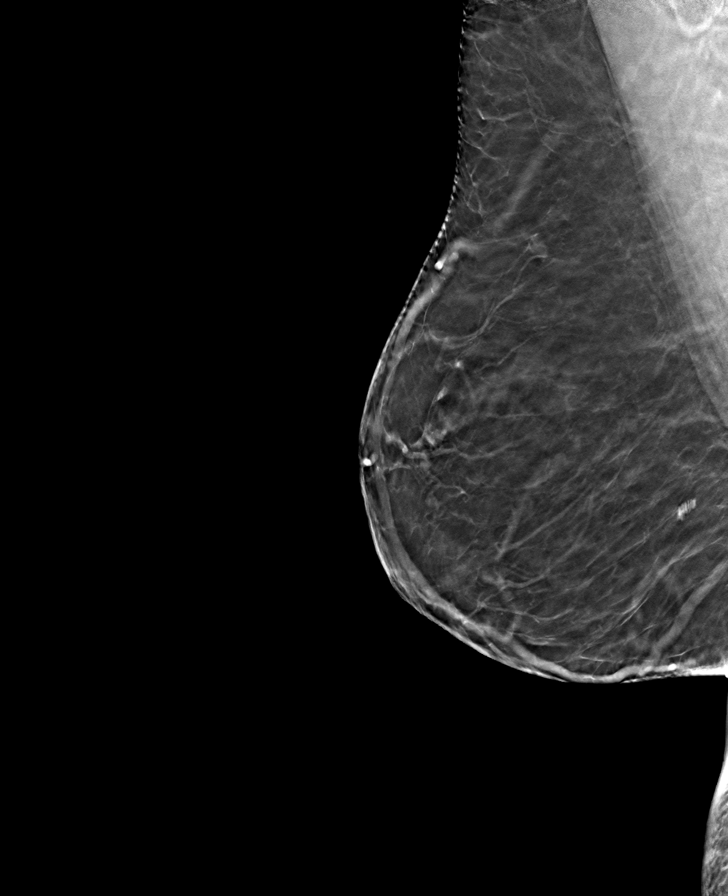

[R CC tomo · tomo slice 24/47.0]
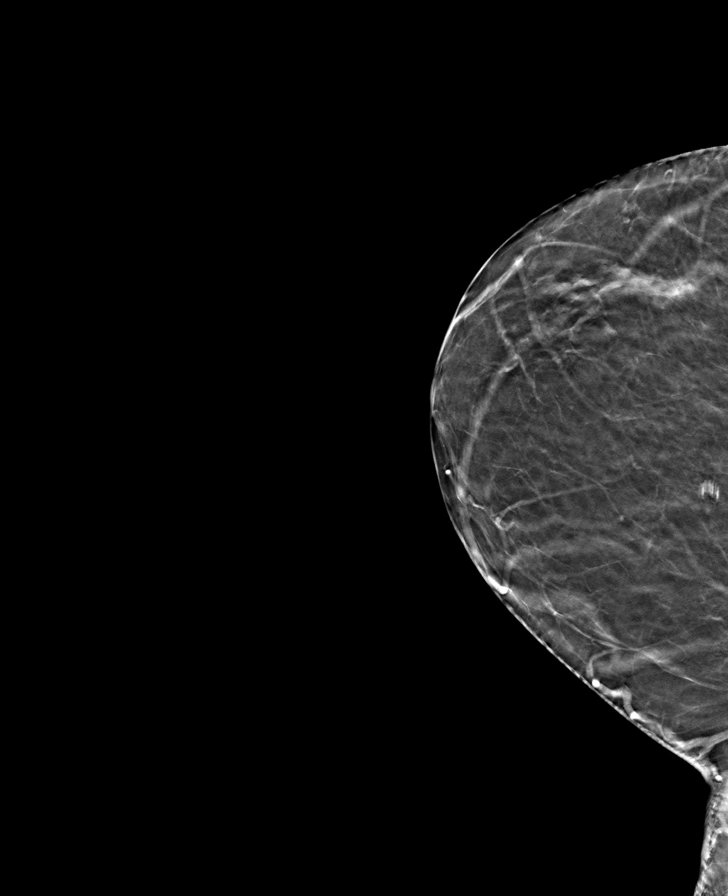

[L MLO tomo · tomo slice 31/61.0]
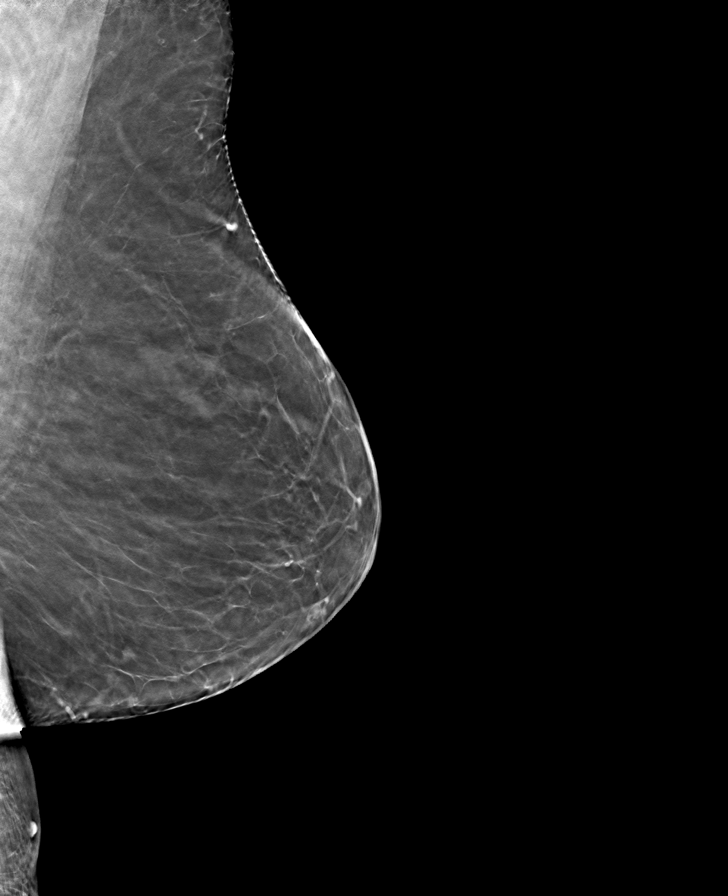

[8 of 24 positions shown; findings below may reference images not displayed]

ACR Breast Density Category b: There are scattered areas of
fibroglandular density.
FINDINGS: There are no findings suspicious for malignancy. Images were
processed with CAD.
IMPRESSION: No mammographic evidence of malignancy. A result letter of this
screening mammogram will be mailed directly to the patient.

RECOMMENDATION:
Screening mammogram in one year. (Code:[id number]-[id number])

BI-RADS CATEGORY  1: Negative.

## 2020-03-19 ENCOUNTER — Inpatient Hospital Stay: Admission: RE | Admit: 2020-03-19 | Payer: Medicare PPO | Source: Ambulatory Visit

## 2020-03-20 ENCOUNTER — Other Ambulatory Visit: Payer: Self-pay | Admitting: Internal Medicine

## 2020-03-20 DIAGNOSIS — I10 Essential (primary) hypertension: Secondary | ICD-10-CM

## 2020-03-20 NOTE — Telephone Encounter (Signed)
Requested medications are due for refill today NO  Requested medications are on the active medication list OV note states to stop after 90 day supply, requesting early  Last refill 2/15  Last visit 3/8  Notes to clinic OV note states to finish 3 months of Toprol already purchased, then stop it and start Lisinopril, please assess.

## 2020-03-22 ENCOUNTER — Other Ambulatory Visit: Payer: Self-pay

## 2020-03-22 ENCOUNTER — Ambulatory Visit
Admission: RE | Admit: 2020-03-22 | Discharge: 2020-03-22 | Disposition: A | Discharge status: Home / Self Care | Payer: Medicare PPO | Source: Ambulatory Visit | Attending: Internal Medicine | Admitting: Internal Medicine

## 2020-03-22 DIAGNOSIS — Z780 Asymptomatic menopausal state: Secondary | ICD-10-CM | POA: Diagnosis not present

## 2020-03-22 DIAGNOSIS — Z13820 Encounter for screening for osteoporosis: Secondary | ICD-10-CM | POA: Diagnosis not present

## 2020-03-22 DIAGNOSIS — Z1231 Encounter for screening mammogram for malignant neoplasm of breast: Secondary | ICD-10-CM | POA: Diagnosis not present

## 2020-04-03 ENCOUNTER — Telehealth: Payer: Self-pay

## 2020-04-03 NOTE — Telephone Encounter (Signed)
Copied from CRM [id number]. Topic: General - Call Back - No Documentation >> Apr 03, 2020 11:53 AM Randol Kern wrote: Best contact: [phone] is requesting a call back from office, she has a form that she needs completed for her disability license plate. Her orthopedic doctor is no longer in practice. She wants to know if PCP is willing to sign forms if she brings them by the office. Please call back and advise, VM available.    Patient tried to reach out to her previous  Orthopaedic doctor which unfortunately had closed. Patient stated that [facility] only saw her for her shoulder she stated that [facility] did not sign off on her placard in the past. She does not know know who to speak with.

## 2020-04-03 NOTE — Telephone Encounter (Signed)
Called and spoke with patient. Informed Dr Judithann Graves said she can do the 5 year placard but she does not do license plates. She verbalized understanding. Put a signed copy of a handicap placard at the front desk pick up basket and she said she will pick up tomorrow.

## 2020-06-07 ENCOUNTER — Telehealth: Payer: Self-pay

## 2020-06-07 NOTE — Telephone Encounter (Signed)
Copied from CRM [id number]. Topic: General - Other >> Jun 07, 2020  4:05 PM Pawlus, Maxine Glenn A wrote: Reason for CRM: Pharmacy was following up on metoprolol succinate (TOPROL-XL) 50 MG 24 hr tablet, pts insurance will not cover this, please advise if an alternative can be sent in.

## 2020-06-08 ENCOUNTER — Other Ambulatory Visit: Payer: Self-pay | Admitting: Internal Medicine

## 2020-06-08 DIAGNOSIS — N3941 Urge incontinence: Secondary | ICD-10-CM

## 2020-06-08 NOTE — Telephone Encounter (Signed)
Requested medication (s) are due for refill today:   Yes refilled this morning by Dr. Judithann Graves  Requested medication (s) are on the active medication list:   Yes  Future visit scheduled:   Yes   Last ordered: Today.    Requesting a 90 day supply   Returned for Dr. Karn Cassis review for refill for 90 days   Requested Prescriptions  Pending Prescriptions Disp Refills   oxybutynin (DITROPAN-XL) 5 MG 24 hr tablet [Pharmacy Med Name: OXYBUTYNIN ER 5MG  TABLETS] 90 tablet     Sig: TAKE 1 TABLET(5 MG) BY MOUTH AT BEDTIME      Urology:  Bladder Agents Passed - 06/08/2020  8:35 AM      Passed - Valid encounter within last 12 months    Recent Outpatient Visits           2 months ago Annual physical exam   [facility], [place]   1 year ago Annual physical exam   [facility], [place]   1 year ago Essential (primary) hypertension   [facility], [place]   2 years ago Annual physical exam   [facility], [place]   2 years ago Bronchitis   [facility], [place]       Future Appointments             In 9 months Reubin Milan Judithann Graves, MD [facility], [facility]

## 2020-06-08 NOTE — Telephone Encounter (Signed)
Requested medications are due for refill today yes  Requested medications are on the active medication list yes  Last refill 3/8  Last visit 03/13/20  Future visit scheduled 03/2021  Notes to clinic Pt seen in March and given a 3 month rx for Oxybutynin, does have an annual appt in 03/2021, please assess if to be continued.

## 2020-06-11 ENCOUNTER — Other Ambulatory Visit: Payer: Self-pay

## 2020-06-11 DIAGNOSIS — I10 Essential (primary) hypertension: Secondary | ICD-10-CM

## 2020-06-11 NOTE — Telephone Encounter (Signed)
Called and spoke with patient. She said she is already taking the alternative. The pharmacy gave her lisinopril in its place. She started this today. If she has any issues with this medication she will call us but told her she has to try what her insurance covers first.

## 2020-06-13 ENCOUNTER — Ambulatory Visit: Payer: Self-pay | Admitting: *Deleted

## 2020-06-13 NOTE — Telephone Encounter (Signed)
Please review.  KP

## 2020-06-13 NOTE — Telephone Encounter (Signed)
It does not sound like the symptoms are due to lisinopril.  However the chest pain and black stool are very concerning and need to be evaluated.

## 2020-06-13 NOTE — Telephone Encounter (Signed)
Started new B/P medication on Monday, Lisinopril 10 mg daily  Later that day she experienced lower intermittent abdominal pain with a very mild pain to her chest-no where else Burning with voiding-" urine is acidic"since starting lisinopril. No problems Tuesday but all symptoms began again yesterday. Black stool started last night, no Pepto no red blood noted in her stool. She believes these symptoms are due to the lisinopril and would like Dr. Karn Cassis opinion and should she be changed back to Toprol XL 50 mg. No B/P reading to report. Denies SOB/fever/dizziness/HA  Routing to office for pcp advice.  Reason for Disposition . [1] MODERATE pain (e.g., interferes with normal activities) AND [2] comes and goes (cramps) AND [3] present > 24 hours  (Exception: pain with Vomiting or Diarrhea - see that Guideline)  Answer Assessment - Initial Assessment Questions 1. LOCATION: "Where does it hurt?"     Mid to lower abdomen 2. RADIATION: "Does the pain shoot anywhere else?" (e.g., chest, back)     chest 3. ONSET: "When did the pain begin?" (e.g., minutes, hours or days ago)      Tuesday 4. SUDDEN: "Gradual or sudden onset?"     gradual 5. PATTERN "Does the pain come and go, or is it constant?"    - If constant: "Is it getting better, staying the same, or worsening?"      (Note: Constant means the pain never goes away completely; most serious pain is constant and it progresses)     - If intermittent: "How long does it last?" "Do you have pain now?"     (Note: Intermittent means the pain goes away completely between bouts)     Comes and goes 6. SEVERITY: "How bad is the pain?"  (e.g., Scale 1-10; mild, moderate, or severe)    - MILD (1-3): doesn't interfere with normal activities, abdomen soft and not tender to touch     - MODERATE (4-7): interferes with normal activities or awakens from sleep, abdomen tender to touch     - SEVERE (8-10): excruciating pain, doubled over, unable to do any normal  activities      mild 7. RECURRENT SYMPTOM: "Have you ever had this type of stomach pain before?" If Yes, ask: "When was the last time?" and "What happened that time?"      no 8. AGGRAVATING FACTORS: "Does anything seem to cause this pain?" (e.g., foods, stress, alcohol)     no 9. CARDIAC SYMPTOMS: "Do you have any of the following symptoms: chest pain, difficulty breathing, sweating, nausea?"     Possibly chest pain near left breast. 10. OTHER SYMPTOMS: "Do you have any other symptoms?" (e.g., back pain, diarrhea, fever, urination pain, vomiting)       Urination pain 11. PREGNANCY: "Is there any chance you are pregnant?" "When was your last menstrual period?"      na  Protocols used: ABDOMINAL PAIN - UPPER-A-AH

## 2020-06-13 NOTE — Telephone Encounter (Signed)
Spoke to pt told her to go to the ER for chest pain and black stool. Pt declined and stated " she will wait it out and see if she gets any better". Told pt if she ends up getting worse she really needs to go to the ER. Pt stated she doesn't want to go to the ER and wait all day. Pt verbalized understanding.  KP

## 2020-06-16 DIAGNOSIS — S8392XA Sprain of unspecified site of left knee, initial encounter: Secondary | ICD-10-CM | POA: Diagnosis not present

## 2020-06-16 DIAGNOSIS — S8390XA Sprain of unspecified site of unspecified knee, initial encounter: Secondary | ICD-10-CM | POA: Diagnosis not present

## 2020-07-03 DIAGNOSIS — S93492S Sprain of other ligament of left ankle, sequela: Secondary | ICD-10-CM | POA: Diagnosis not present

## 2020-07-03 DIAGNOSIS — M19072 Primary osteoarthritis, left ankle and foot: Secondary | ICD-10-CM | POA: Diagnosis not present

## 2020-07-03 DIAGNOSIS — M5416 Radiculopathy, lumbar region: Secondary | ICD-10-CM | POA: Diagnosis not present

## 2020-07-03 DIAGNOSIS — M65872 Other synovitis and tenosynovitis, left ankle and foot: Secondary | ICD-10-CM | POA: Diagnosis not present

## 2020-07-23 DIAGNOSIS — M5416 Radiculopathy, lumbar region: Secondary | ICD-10-CM | POA: Diagnosis not present

## 2020-07-23 DIAGNOSIS — M25572 Pain in left ankle and joints of left foot: Secondary | ICD-10-CM | POA: Diagnosis not present

## 2020-07-27 DIAGNOSIS — H903 Sensorineural hearing loss, bilateral: Secondary | ICD-10-CM | POA: Diagnosis not present

## 2020-07-30 DIAGNOSIS — M5416 Radiculopathy, lumbar region: Secondary | ICD-10-CM | POA: Diagnosis not present

## 2020-07-30 DIAGNOSIS — M25572 Pain in left ankle and joints of left foot: Secondary | ICD-10-CM | POA: Diagnosis not present

## 2020-08-15 DIAGNOSIS — Z833 Family history of diabetes mellitus: Secondary | ICD-10-CM | POA: Diagnosis not present

## 2020-08-15 DIAGNOSIS — Z7982 Long term (current) use of aspirin: Secondary | ICD-10-CM | POA: Diagnosis not present

## 2020-08-15 DIAGNOSIS — I2510 Atherosclerotic heart disease of native coronary artery without angina pectoris: Secondary | ICD-10-CM | POA: Diagnosis not present

## 2020-08-15 DIAGNOSIS — Z6838 Body mass index (BMI) 38.0-38.9, adult: Secondary | ICD-10-CM | POA: Diagnosis not present

## 2020-08-15 DIAGNOSIS — E669 Obesity, unspecified: Secondary | ICD-10-CM | POA: Diagnosis not present

## 2020-08-15 DIAGNOSIS — I10 Essential (primary) hypertension: Secondary | ICD-10-CM | POA: Diagnosis not present

## 2020-08-15 DIAGNOSIS — Z8249 Family history of ischemic heart disease and other diseases of the circulatory system: Secondary | ICD-10-CM | POA: Diagnosis not present

## 2020-08-15 DIAGNOSIS — R32 Unspecified urinary incontinence: Secondary | ICD-10-CM | POA: Diagnosis not present

## 2020-08-15 DIAGNOSIS — M1990 Unspecified osteoarthritis, unspecified site: Secondary | ICD-10-CM | POA: Diagnosis not present

## 2020-09-03 ENCOUNTER — Other Ambulatory Visit: Payer: Self-pay | Admitting: Internal Medicine

## 2020-09-03 DIAGNOSIS — I10 Essential (primary) hypertension: Secondary | ICD-10-CM

## 2020-09-03 NOTE — Telephone Encounter (Signed)
Requested Prescriptions  Pending Prescriptions Disp Refills  . lisinopril (ZESTRIL) 10 MG tablet [Pharmacy Med Name: LISINOPRIL 10MG  TABLETS] 90 tablet 1    Sig: TAKE 1 TABLET(10 MG) BY MOUTH DAILY     Cardiovascular:  ACE Inhibitors Failed - 09/03/2020  3:33 AM      Failed - Cr in normal range and within 180 days    Creatinine  Date Value Ref Range Status  11/20/2011 1.01 0.60 - 1.30 mg/dL Final   Creatinine, Ser  Date Value Ref Range Status  03/13/2020 1.09 (H) 0.57 - 1.00 mg/dL Final         Passed - K in normal range and within 180 days    Potassium  Date Value Ref Range Status  03/13/2020 4.6 3.5 - 5.2 mmol/L Final  11/20/2011 3.9 3.5 - 5.1 mmol/L Final         Passed - Patient is not pregnant      Passed - Last BP in normal range    BP Readings from Last 1 Encounters:  03/13/20 134/82         Passed - Valid encounter within last 6 months    Recent Outpatient Visits          5 months ago Annual physical exam   [facility], [place]   1 year ago Annual physical exam   [facility], [place]   1 year ago Essential (primary) hypertension   [facility], [place]   2 years ago Annual physical exam   [facility], [place]   2 years ago Bronchitis   [facility], [place]      Future Appointments            In 6 months Reubin Milan Judithann Graves, MD [facility], [facility]

## 2020-09-11 DIAGNOSIS — H6123 Impacted cerumen, bilateral: Secondary | ICD-10-CM | POA: Diagnosis not present

## 2020-09-11 DIAGNOSIS — H903 Sensorineural hearing loss, bilateral: Secondary | ICD-10-CM | POA: Diagnosis not present

## 2020-09-27 ENCOUNTER — Telehealth: Payer: Self-pay

## 2020-09-27 ENCOUNTER — Ambulatory Visit: Payer: Medicare PPO | Admitting: Internal Medicine

## 2020-09-27 ENCOUNTER — Encounter: Payer: Self-pay | Admitting: Internal Medicine

## 2020-09-27 ENCOUNTER — Other Ambulatory Visit: Payer: Self-pay

## 2020-09-27 VITALS — BP 128/82 | HR 72 | Temp 98.0°F | Ht 60.0 in | Wt 186.0 lb

## 2020-09-27 DIAGNOSIS — N3001 Acute cystitis with hematuria: Secondary | ICD-10-CM

## 2020-09-27 LAB — POCT UA - MICROSCOPIC ONLY
Casts, Ur, LPF, POC: 0
Crystals, Ur, HPF, POC: 0
Epithelial cells, urine per micros: 2
Mucus, UA: 0
Yeast, UA: 0

## 2020-09-27 MED ORDER — NITROFURANTOIN MONOHYD MACRO 100 MG PO CAPS
100.0000 mg | ORAL_CAPSULE | Freq: Two times a day (BID) | ORAL | 0 refills | Status: AC
Start: 2020-09-27 — End: 2020-10-04

## 2020-09-27 NOTE — Telephone Encounter (Signed)
Papers mailed back to pt. Left VM stating they are being mailed back.  KP

## 2020-09-27 NOTE — Progress Notes (Signed)
Date:  09/27/2020   Name:  Maria Gibson   DOB:  May 02, 1940   MRN:  [id number]   Chief Complaint: Urinary Frequency (Pain, took azo this morning, burning, itching )  Urinary Frequency  This is a new problem. The current episode started today. The problem occurs every urination. The quality of the pain is described as burning. The pain is mild. There has been no fever. Associated symptoms include frequency and urgency. Pertinent negatives include no chills, flank pain, hematuria, nausea or vomiting. She has tried increased fluids (AZO) for the symptoms. The treatment provided mild relief.   Lab Results  Component Value Date   CREATININE 1.09 (H) 03/13/2020   BUN 16 03/13/2020   NA 140 03/13/2020   K 4.6 03/13/2020   CL 100 03/13/2020   CO2 25 03/13/2020   Lab Results  Component Value Date   CHOL 190 03/13/2020   HDL 74 03/13/2020   LDLCALC 90 03/13/2020   TRIG 155 (H) 03/13/2020   CHOLHDL 2.6 03/13/2020   Lab Results  Component Value Date   TSH 2.580 03/13/2020   No results found for: HGBA1C Lab Results  Component Value Date   WBC 7.2 03/13/2020   HGB 16.6 (H) 03/13/2020   HCT 48.6 (H) 03/13/2020   MCV 95 03/13/2020   PLT 225 03/13/2020   Lab Results  Component Value Date   ALT 21 03/13/2020   AST 21 03/13/2020   ALKPHOS 62 03/13/2020   BILITOT 0.5 03/13/2020     Review of Systems  Constitutional:  Negative for chills, fatigue and fever.  Respiratory:  Negative for cough, chest tightness, shortness of breath and wheezing.   Cardiovascular:  Negative for chest pain and palpitations.  Gastrointestinal:  Negative for nausea and vomiting.  Genitourinary:  Positive for dysuria, frequency and urgency. Negative for difficulty urinating, flank pain and hematuria.  Neurological:  Negative for dizziness and headaches.  Psychiatric/Behavioral:  Negative for dysphoric mood and sleep disturbance. The patient is not nervous/anxious.    Patient Active Problem List    Diagnosis Date Noted   Artificial knee joint present 12/23/2018   Obesity, morbid (HCC) 03/08/2018   Osteoarthritis of right shoulder 03/06/2017   Arthritis of right knee 04/15/2016   Blepharoptosis, acquired, left 04/15/2016   OA (osteoarthritis) of ankle 10/17/2015   Essential (primary) hypertension 12/01/2014   GERD without esophagitis 12/01/2014   Hypertriglyceridemia 12/01/2014   Degenerative arthritis of finger 12/01/2014   Urge incontinence of urine 12/01/2014   Sequelae of cerebral infarction 12/01/2014    Allergies  Allergen Reactions   Codeine    Iodine    Morphine Sulfate    Shellfish Allergy    Sulfa Antibiotics Nausea And Vomiting    Past Surgical History:  Procedure Laterality Date   INTRACAPSULAR CATARACT EXTRACTION Bilateral 2017   KNEE SURGERY     REVERSE TOTAL SHOULDER ARTHROPLASTY Right 07/2018   TOTAL SHOULDER REPLACEMENT Left    TUBAL LIGATION     VARICOSE VEIN SURGERY      Social History   Tobacco Use   Smoking status: Never   Smokeless tobacco: Never   Tobacco comments:    smoking cessation materials not required  Vaping Use   Vaping Use: Never used  Substance Use Topics   Alcohol use: Yes    Alcohol/week: 1.0 standard drink    Types: 1 Glasses of wine per week    Comment: occasional   Drug use: No     Medication list  has been reviewed and updated.  Current Meds  Medication Sig   aspirin 81 MG chewable tablet 324 mg.    Calcium Carb-Cholecalciferol (CALCIUM + D3) 600-200 MG-UNIT TABS Take by mouth.   COLLAGEN PO Take by mouth. Collagen protein powder mixed with coffee daily in AM   lisinopril (ZESTRIL) 10 MG tablet TAKE 1 TABLET(10 MG) BY MOUTH DAILY   meloxicam (MOBIC) 15 MG tablet meloxicam 15 mg tablet   Multiple Vitamins-Minerals (MULTIVITAMIN ADULTS 50+ PO) Take by mouth.   nitrofurantoin, macrocrystal-monohydrate, (MACROBID) 100 MG capsule Take 1 capsule (100 mg total) by mouth 2 (two) times daily for 7 days.   OVER THE  COUNTER MEDICATION Lectin shield gundry supplement BID   OVER THE COUNTER MEDICATION Bio complete 3 probiotic gundry supply   oxybutynin (DITROPAN-XL) 5 MG 24 hr tablet TAKE 1 TABLET(5 MG) BY MOUTH AT BEDTIME    PHQ 2/9 Scores 09/27/2020 03/13/2020 03/05/2020 03/02/2019  PHQ - 2 Score 0 0 0 0  PHQ- 9 Score 0 0 - -    GAD 7 : Generalized Anxiety Score 09/27/2020 03/13/2020  Nervous, Anxious, on Edge 0 0  Control/stop worrying 0 0  Worry too much - different things 0 0  Trouble relaxing 0 0  Restless 0 0  Easily annoyed or irritable 0 0  Afraid - awful might happen 0 0  Total GAD 7 Score 0 0  Anxiety Difficulty - Not difficult at all    BP Readings from Last 3 Encounters:  09/27/20 128/82  03/13/20 134/82  03/05/20 120/78    Physical Exam Vitals and nursing note reviewed.  Constitutional:      Appearance: She is well-developed.  Cardiovascular:     Rate and Rhythm: Normal rate and regular rhythm.     Heart sounds: Normal heart sounds.  Pulmonary:     Effort: Pulmonary effort is normal. No respiratory distress.     Breath sounds: Normal breath sounds.  Abdominal:     General: Bowel sounds are normal.     Palpations: Abdomen is soft.     Tenderness: There is abdominal tenderness in the suprapubic area. There is no right CVA tenderness, left CVA tenderness, guarding or rebound.    Wt Readings from Last 3 Encounters:  09/27/20 186 lb (84.4 kg)  03/13/20 216 lb (98 kg)  03/05/20 217 lb 6.4 oz (98.6 kg)    BP 128/82   Pulse 72   Temp 98 F (36.7 C) (Oral)   Ht 5' (1.524 m)   Wt 186 lb (84.4 kg)   BMI 36.33 kg/m   Assessment and Plan: 1. Acute cystitis with hematuria Continue to push fluids AZO x 2 days if needed Follow up if sx persist - POCT UA - Microscopic Only - nitrofurantoin, macrocrystal-monohydrate, (MACROBID) 100 MG capsule; Take 1 capsule (100 mg total) by mouth 2 (two) times daily for 7 days.  Dispense: 14 capsule; Refill: 0   Partially dictated using  Animal nutritionist. Any errors are unintentional.  Bari Edward, MD [facility]  09/27/2020

## 2020-09-27 NOTE — Telephone Encounter (Signed)
Copied from CRM [id number]. Topic: General - Other >> Sep 27, 2020 12:15 PM Eliseo Gum, [place] wrote: Reason for IDH:[id number] called in asking if the paperwork for her lifeline etc can be mailed to her, since she didn't get them back when she was in the office. Please call back

## 2020-12-14 DIAGNOSIS — Z961 Presence of intraocular lens: Secondary | ICD-10-CM | POA: Diagnosis not present

## 2020-12-31 ENCOUNTER — Other Ambulatory Visit: Payer: Self-pay

## 2020-12-31 ENCOUNTER — Ambulatory Visit
Admission: EM | Admit: 2020-12-31 | Discharge: 2020-12-31 | Disposition: A | Discharge status: Home / Self Care | Payer: Medicare PPO

## 2020-12-31 DIAGNOSIS — H1031 Unspecified acute conjunctivitis, right eye: Secondary | ICD-10-CM

## 2020-12-31 MED ORDER — POLYMYXIN B-TRIMETHOPRIM 10000-0.1 UNIT/ML-% OP SOLN: 1.0000 [drp] | OPHTHALMIC | 0 refills | Status: DC

## 2020-12-31 NOTE — ED Triage Notes (Signed)
Patient is here for "right upper eye lid swelling". No visual changes in right eye. No injuyr to eye known. Concerned with this because of a "stroke in 2006".

## 2020-12-31 NOTE — Discharge Instructions (Signed)
Today you being treated for bacterial conjunctivitis.   Place one drop of polytrim into the effected eye every 4 hours while awake for 7 days. If the other eye starts to have symptoms you may use medication in it as well. Do not allow tip of dropper to touch eye. May use cool compress for comfort and to remove discharge if present. Pat the eye, do not wipe.  Do not rub eyes, this may cause more irritation.  May use benadryl as needed to help if itching present.  Please avoid use of eye makeup until symptoms clear.  If symptoms persist after use of medication, please follow up at Urgent Care or with ophthalmologist (eye doctor)  

## 2020-12-31 NOTE — ED Provider Notes (Addendum)
[facility]    CSN: [id number] Arrival date & time: 12/31/20  1251      History   Chief Complaint Chief Complaint  Patient presents with   Eye Problem    HPI Maria Gibson is a 80 y.o. female.   Patient presents with right upper lid swelling and redness for 2 days.  Endorses that eyelashes were sticky this morning and mildly itchy .  No known sick contacts. denies blurred vision, light sensitivity, eye redness.  Has not attempted treatment of symptoms.  Concern for possible stroke due to history of CVA in 2006.  Past Medical History:  Diagnosis Date   Hypertension     Patient Active Problem List   Diagnosis Date Noted   Artificial knee joint present 12/23/2018   Obesity, morbid (HCC) 03/08/2018   Osteoarthritis of right shoulder 03/06/2017   Arthritis of right knee 04/15/2016   Blepharoptosis, acquired, left 04/15/2016   OA (osteoarthritis) of ankle 10/17/2015   Essential (primary) hypertension 12/01/2014   GERD without esophagitis 12/01/2014   Hypertriglyceridemia 12/01/2014   Degenerative arthritis of finger 12/01/2014   Urge incontinence of urine 12/01/2014   Sequelae of cerebral infarction 12/01/2014    Past Surgical History:  Procedure Laterality Date   INTRACAPSULAR CATARACT EXTRACTION Bilateral 2017   KNEE SURGERY     REVERSE TOTAL SHOULDER ARTHROPLASTY Right 07/2018   TOTAL SHOULDER REPLACEMENT Left    TUBAL LIGATION     VARICOSE VEIN SURGERY      OB History   No obstetric history on file.      Home Medications    Prior to Admission medications   Medication Sig Start Date End Date Taking? Authorizing Provider  aspirin 325 MG EC tablet Take 325 mg by mouth daily.   Yes [provider]  lisinopril (ZESTRIL) 10 MG tablet TAKE 1 TABLET(10 MG) BY MOUTH DAILY 09/03/20  Yes Reubin Milan, MD  aspirin 81 MG chewable tablet 324 mg.     [provider]  Calcium Carb-Cholecalciferol (CALCIUM + D3) 600-200 MG-UNIT  TABS Take by mouth.    [provider]  COLLAGEN PO Take by mouth. Collagen protein powder mixed with coffee daily in AM    [provider]  meloxicam (MOBIC) 15 MG tablet meloxicam 15 mg tablet    [provider]  Multiple Vitamins-Minerals (MULTIVITAMIN ADULTS 50+ PO) Take by mouth.    [provider]  OVER THE COUNTER MEDICATION Lectin shield gundry supplement BID    [provider]  OVER THE COUNTER MEDICATION Bio complete 3 probiotic gundry supply    [provider]  oxybutynin (DITROPAN-XL) 5 MG 24 hr tablet TAKE 1 TABLET(5 MG) BY MOUTH AT BEDTIME 06/08/20   Reubin Milan, MD    Family History Family History  Problem Relation Age of Onset   Heart failure Mother    Diabetes Father    Heart failure Father    Breast cancer Neg Hx     Social History Social History   Tobacco Use   Smoking status: Never   Smokeless tobacco: Never   Tobacco comments:    smoking cessation materials not required  Vaping Use   Vaping Use: Never used  Substance Use Topics   Alcohol use: Yes    Alcohol/week: 1.0 standard drink    Types: 1 Glasses of wine per week    Comment: occasional   Drug use: No     Allergies   Codeine, Iodine, Morphine sulfate, Shellfish  allergy, and Sulfa antibiotics   Review of Systems Review of Systems  Constitutional: Negative.   Eyes:  Positive for itching. Negative for photophobia, pain, discharge, redness and visual disturbance.  Cardiovascular: Negative.   Skin: Negative.   Neurological: Negative.     Physical Exam Triage Vital Signs ED Triage Vitals [12/31/20 1304]  Enc Vitals Group     BP (!) 171/94     Pulse Rate 77     Resp 20     Temp 98 F (36.7 C)     Temp Source Oral     SpO2 99 %     Weight 181 lb (82.1 kg)     Height      Head Circumference      Peak Flow      Pain Score 0     Pain Loc      Pain Edu?      Excl. in GC?    No data found.  Updated Vital Signs BP (!) 171/94  (BP Location: Left Arm) Comment: History of HTN, eating alot of salt over holidays.   Pulse 77    Temp 98 F (36.7 C) (Oral)    Resp 20    Wt 181 lb (82.1 kg)    SpO2 99%    BMI 35.35 kg/m   Visual Acuity Right Eye Distance:   Left Eye Distance:   Bilateral Distance:    Right Eye Near:   Left Eye Near:    Bilateral Near:     Physical Exam Constitutional:      Appearance: Normal appearance. She is normal weight.  HENT:     Head: Normocephalic.  Eyes:     General:        Right eye: No hordeolum.     Comments: Erythema to the right upper eyelid, scant discharge present on the eyelashes, vision intact, extraocular movements intact, no involvement of the left eye  Pulmonary:     Effort: Pulmonary effort is normal.  Skin:    General: Skin is warm and dry.  Neurological:     Mental Status: She is alert and oriented to person, place, and time. Mental status is at baseline.  Psychiatric:        Mood and Affect: Mood normal.        Behavior: Behavior normal.     UC Treatments / Results  Labs (all labs ordered are listed, but only abnormal results are displayed) Labs Reviewed - No data to display  EKG   Radiology No results found.  Procedures Procedures (including critical care time)  Medications Ordered in UC Medications - No data to display  Initial Impression / Assessment and Plan / UC Course  I have reviewed the triage vital signs and the nursing notes.  Pertinent labs & imaging results that were available during my care of the patient were reviewed by me and considered in my medical decision making (see chart for details).  Bacterial conjunctivitis of right eye  1.  Polytrim 1 drop every 4 hours for 7 days, patient may use medication  left eye if symptoms occur 2.  Advise cool compresses for comfort and to remove drainage, avoidance of touching to prevent spreading, or antihistamines as needed for itching, patient endorses that she does not wear contacts or use  make-up 3.  Ophthalmology follow-up for persistent  Final Clinical Impressions(s) / UC Diagnoses   Final diagnoses:  None   Discharge Instructions   None    ED Prescriptions  None    PDMP not reviewed this encounter.   Valinda Hoar, NP 12/31/20 1322    Valinda Hoar, NP 12/31/20 1322

## 2021-01-15 ENCOUNTER — Other Ambulatory Visit: Payer: Self-pay

## 2021-01-15 ENCOUNTER — Ambulatory Visit: Payer: Medicare PPO | Admitting: Internal Medicine

## 2021-01-15 ENCOUNTER — Encounter: Payer: Self-pay | Admitting: Internal Medicine

## 2021-01-15 ENCOUNTER — Telehealth: Payer: Self-pay

## 2021-01-15 VITALS — BP 130/90 | HR 78 | Ht 60.0 in | Wt 180.6 lb

## 2021-01-15 DIAGNOSIS — J0100 Acute maxillary sinusitis, unspecified: Secondary | ICD-10-CM | POA: Diagnosis not present

## 2021-01-15 DIAGNOSIS — L02429 Furuncle of limb, unspecified: Secondary | ICD-10-CM

## 2021-01-15 MED ORDER — AMOXICILLIN-POT CLAVULANATE 875-125 MG PO TABS: 1.0000 | ORAL_TABLET | Freq: Two times a day (BID) | ORAL | 0 refills | Status: AC

## 2021-01-15 NOTE — Telephone Encounter (Signed)
Documented in patient's chart appropriately.

## 2021-01-15 NOTE — Progress Notes (Signed)
Date:  01/15/2021   Name:  Maria Gibson   DOB:  1940-12-16   MRN:  [id number]   Chief Complaint: Sinusitis  Sinusitis This is a new problem. The current episode started 1 to 4 weeks ago. There has been no fever. The pain is mild. Associated symptoms include sinus pressure. Pertinent negatives include no chills, coughing, ear pain, headaches, shortness of breath or sore throat.   Boil - she cut off a skin tag on her inner arm and it developed a small abscess.  A few days ago she expressed some pus and it is now scabbed over.  Minimal discomfort at this time.  Lab Results  Component Value Date   NA 140 03/13/2020   K 4.6 03/13/2020   CO2 25 03/13/2020   GLUCOSE 97 03/13/2020   BUN 16 03/13/2020   CREATININE 1.09 (H) 03/13/2020   CALCIUM 9.4 03/13/2020   EGFR 51 (L) 03/13/2020   GFRNONAA 49 (L) 03/09/2019   Lab Results  Component Value Date   CHOL 190 03/13/2020   HDL 74 03/13/2020   LDLCALC 90 03/13/2020   TRIG 155 (H) 03/13/2020   CHOLHDL 2.6 03/13/2020   Lab Results  Component Value Date   TSH 2.580 03/13/2020   No results found for: HGBA1C Lab Results  Component Value Date   WBC 7.2 03/13/2020   HGB 16.6 (H) 03/13/2020   HCT 48.6 (H) 03/13/2020   MCV 95 03/13/2020   PLT 225 03/13/2020   Lab Results  Component Value Date   ALT 21 03/13/2020   AST 21 03/13/2020   ALKPHOS 62 03/13/2020   BILITOT 0.5 03/13/2020   No results found for: 25OHVITD2, 25OHVITD3, VD25OH   Review of Systems  Constitutional:  Positive for unexpected weight change (losing steadily with [employer]). Negative for chills, fatigue and fever.  HENT:  Positive for hearing loss and sinus pressure. Negative for ear pain, sore throat and trouble swallowing.   Respiratory:  Negative for cough and shortness of breath.   Cardiovascular:  Negative for chest pain and leg swelling.  Neurological:  Negative for dizziness, light-headedness and headaches.   Patient Active Problem List   Diagnosis  Date Noted   Artificial knee joint present 12/23/2018   Obesity, morbid ([place]) 03/08/2018   Osteoarthritis of right shoulder 03/06/2017   Arthritis of right knee 04/15/2016   Blepharoptosis, acquired, left 04/15/2016   OA (osteoarthritis) of ankle 10/17/2015   Essential (primary) hypertension 12/01/2014   GERD without esophagitis 12/01/2014   Hypertriglyceridemia 12/01/2014   Degenerative arthritis of finger 12/01/2014   Urge incontinence of urine 12/01/2014   Sequelae of cerebral infarction 12/01/2014    Allergies  Allergen Reactions   Codeine    Iodine    Morphine Sulfate    Shellfish Allergy    Sulfa Antibiotics Nausea And Vomiting    Past Surgical History:  Procedure Laterality Date   INTRACAPSULAR CATARACT EXTRACTION Bilateral 2017   KNEE SURGERY     REVERSE TOTAL SHOULDER ARTHROPLASTY Right 07/2018   TOTAL SHOULDER REPLACEMENT Left    TUBAL LIGATION     VARICOSE VEIN SURGERY      Social History   Tobacco Use   Smoking status: Never   Smokeless tobacco: Never   Tobacco comments:    smoking cessation materials not required  Vaping Use   Vaping Use: Never used  Substance Use Topics   Alcohol use: Yes    Alcohol/week: 1.0 standard drink    Types: 1 Glasses of  wine per week    Comment: occasional   Drug use: No     Medication list has been reviewed and updated.  Current Meds  Medication Sig   aspirin 325 MG EC tablet Take 325 mg by mouth daily.   aspirin 81 MG chewable tablet 324 mg.    Calcium Carb-Cholecalciferol (CALCIUM + D3) 600-200 MG-UNIT TABS Take by mouth.   COLLAGEN PO Take by mouth. Collagen protein powder mixed with coffee daily in AM   lisinopril (ZESTRIL) 10 MG tablet TAKE 1 TABLET(10 MG) BY MOUTH DAILY   meloxicam (MOBIC) 15 MG tablet meloxicam 15 mg tablet   Multiple Vitamins-Minerals (MULTIVITAMIN ADULTS 50+ PO) Take by mouth.   OVER THE COUNTER MEDICATION Lectin shield gundry supplement BID   OVER THE COUNTER MEDICATION Bio complete 3  probiotic gundry supply   oxybutynin (DITROPAN-XL) 5 MG 24 hr tablet TAKE 1 TABLET(5 MG) BY MOUTH AT BEDTIME   trimethoprim-polymyxin b (POLYTRIM) ophthalmic solution Place 1 drop into both eyes every 4 (four) hours.    PHQ 2/9 Scores 01/15/2021 09/27/2020 03/13/2020 03/05/2020  PHQ - 2 Score 0 0 0 0  PHQ- 9 Score 2 0 0 -    GAD 7 : Generalized Anxiety Score 09/27/2020 03/13/2020  Nervous, Anxious, on Edge 0 0  Control/stop worrying 0 0  Worry too much - different things 0 0  Trouble relaxing 0 0  Restless 0 0  Easily annoyed or irritable 0 0  Afraid - awful might happen 0 0  Total GAD 7 Score 0 0  Anxiety Difficulty - Not difficult at all    BP Readings from Last 3 Encounters:  01/15/21 130/90  12/31/20 (!) 171/94  09/27/20 128/82    Physical Exam Vitals and nursing note reviewed.  Constitutional:      General: She is not in acute distress.    Appearance: She is well-developed.  HENT:     Head: Normocephalic and atraumatic.     Right Ear: Ear canal and external ear normal. Tympanic membrane is not erythematous or retracted.     Left Ear: Ear canal and external ear normal. Tympanic membrane is not erythematous or retracted.     Nose:     Right Sinus: Maxillary sinus tenderness present. No frontal sinus tenderness.     Left Sinus: No maxillary sinus tenderness or frontal sinus tenderness.     Mouth/Throat:     Mouth: No oral lesions.     Pharynx: Uvula midline.  Cardiovascular:     Rate and Rhythm: Normal rate and regular rhythm.     Pulses: Normal pulses.     Heart sounds: Normal heart sounds.  Pulmonary:     Effort: Pulmonary effort is normal. No respiratory distress.     Breath sounds: Normal breath sounds. No wheezing, rhonchi or rales.  Musculoskeletal:     Cervical back: Normal range of motion.     Right lower leg: No edema.  Lymphadenopathy:     Cervical: No cervical adenopathy.  Skin:    General: Skin is warm and dry.     Findings: Lesion (inner right upper  arm) present. No rash.          Comments: 0.5 cm flat eschar with mild surrounding erythema - no fluctuance or drainage.  Neurological:     Mental Status: She is alert and oriented to person, place, and time.  Psychiatric:        Mood and Affect: Mood normal.  Behavior: Behavior normal.    Wt Readings from Last 3 Encounters:  01/15/21 180 lb 9.6 oz (81.9 kg)  12/31/20 181 lb (82.1 kg)  09/27/20 186 lb (84.4 kg)    BP 130/90    Pulse 78    Ht 5' (1.524 m)    Wt 180 lb 9.6 oz (81.9 kg)    SpO2 97%    BMI 35.27 kg/m   Assessment and Plan: 1. Acute non-recurrent maxillary sinusitis Likely the source of her recent conjunctivitis. If pain persists, may need dental xrays - amoxicillin-clavulanate (AUGMENTIN) 875-125 MG tablet; Take 1 tablet by mouth 2 (two) times daily for 10 days.  Dispense: 20 tablet; Refill: 0  2. Boil of upper extremity Local care with TAO. Tdap up to date per patient - will call with dates. - amoxicillin-clavulanate (AUGMENTIN) 875-125 MG tablet; Take 1 tablet by mouth 2 (two) times daily for 10 days.  Dispense: 20 tablet; Refill: 0   Partially dictated using Editor, commissioning. Any errors are unintentional.  Halina Maidens, MD [facility]  01/15/2021

## 2021-01-15 NOTE — Telephone Encounter (Signed)
Copied from [place] [id number]. Topic: General - Other >> Jan 15, 2021 11:13 AM Maria Gibson wrote: Reason for CRM: Pt reports that she had her last tetanus shot on 09/17/20.

## 2021-02-03 ENCOUNTER — Ambulatory Visit
Admission: EM | Admit: 2021-02-03 | Discharge: 2021-02-03 | Disposition: A | Discharge status: Home / Self Care | Payer: Medicare PPO | Attending: Emergency Medicine | Admitting: Emergency Medicine

## 2021-02-03 ENCOUNTER — Other Ambulatory Visit: Payer: Self-pay

## 2021-02-03 DIAGNOSIS — N3001 Acute cystitis with hematuria: Secondary | ICD-10-CM | POA: Insufficient documentation

## 2021-02-03 DIAGNOSIS — B3731 Acute candidiasis of vulva and vagina: Secondary | ICD-10-CM | POA: Insufficient documentation

## 2021-02-03 LAB — URINALYSIS, MICROSCOPIC (REFLEX): WBC, UA: >50 WBC/hpf (ref 0–5)

## 2021-02-03 LAB — WET PREP, GENITAL
Clue Cells Wet Prep HPF POC: NONE SEEN
Sperm: NONE SEEN
Trich, Wet Prep: NONE SEEN
WBC, Wet Prep HPF POC: <10 — AB (ref <10)
Yeast Wet Prep HPF POC: NONE SEEN

## 2021-02-03 LAB — URINALYSIS, ROUTINE W REFLEX MICROSCOPIC
Bilirubin Urine: NEGATIVE
Glucose, UA: 100 mg/dL — AB
Ketones, ur: NEGATIVE mg/dL
Nitrite: POSITIVE — AB
Protein, ur: 30 mg/dL — AB
Specific Gravity, Urine: 1.015 (ref 1.005–1.030)
pH: 7 (ref 5.0–8.0)

## 2021-02-03 MED ORDER — PHENAZOPYRIDINE HCL 200 MG PO TABS: 200.0000 mg | ORAL_TABLET | Freq: Three times a day (TID) | ORAL | 0 refills | Status: DC

## 2021-02-03 MED ORDER — NITROFURANTOIN MONOHYD MACRO 100 MG PO CAPS: 100.0000 mg | ORAL_CAPSULE | Freq: Two times a day (BID) | ORAL | 0 refills | Status: DC

## 2021-02-03 MED ORDER — FLUCONAZOLE 200 MG PO TABS
200.0000 mg | ORAL_TABLET | Freq: Every day | ORAL | 1 refills | Status: AC
Start: 2021-02-03 — End: 2021-02-05

## 2021-02-03 NOTE — Discharge Instructions (Addendum)
Take the Macrobid twice daily for 5 days with food for treatment of urinary tract infection.  Use the Pyridium every 8 hours as needed for urinary discomfort.  This will turn your urine a bright red-orange.  Increase your oral fluid intake so that you increase your urine production and or flushing your urinary system.  Take an over-the-counter probiotic, such as Culturelle-Align-Activia, 1 hour after each dose of antibiotic to prevent diarrhea or yeast infections from forming.  We will culture urine and change the antibiotics if necessary.  Your urinalysis also showed the presence of yeast.  Take the Diflucan once now and repeat in 7 days if you are still experiencing symptoms.  Return for reevaluation, or see your primary care provider, for any new or worsening symptoms.

## 2021-02-03 NOTE — ED Provider Notes (Signed)
[facility]    CSN: [id number] Arrival date & time: 02/03/21  0802      History   Chief Complaint Chief Complaint  Patient presents with   UTI Symptoms    HPI Maria Gibson is a 81 y.o. female.   HPI  81 year old female here for evaluation of urinary complaints.  Patient reports that she developed pain with urination that was associated with urinary urgency and frequency last night.  She denies any fever, low back pain, suprapubic pain, vaginal discharge, or vaginal itching.  However, she did just finish a 10-day course of amoxicillin for a dental infection 8 days prior to onset of symptoms.  Past Medical History:  Diagnosis Date   Hypertension     Patient Active Problem List   Diagnosis Date Noted   Artificial knee joint present 12/23/2018   Obesity, morbid ([place]) 03/08/2018   Osteoarthritis of right shoulder 03/06/2017   Arthritis of right knee 04/15/2016   Blepharoptosis, acquired, left 04/15/2016   OA (osteoarthritis) of ankle 10/17/2015   Essential (primary) hypertension 12/01/2014   GERD without esophagitis 12/01/2014   Hypertriglyceridemia 12/01/2014   Degenerative arthritis of finger 12/01/2014   Urge incontinence of urine 12/01/2014   Sequelae of cerebral infarction 12/01/2014    Past Surgical History:  Procedure Laterality Date   INTRACAPSULAR CATARACT EXTRACTION Bilateral 2017   KNEE SURGERY     REVERSE TOTAL SHOULDER ARTHROPLASTY Right 07/2018   TOTAL SHOULDER REPLACEMENT Left    TUBAL LIGATION     VARICOSE VEIN SURGERY      OB History   No obstetric history on file.      Home Medications    Prior to Admission medications   Medication Sig Start Date End Date Taking? Authorizing Provider  aspirin 325 MG EC tablet Take 325 mg by mouth daily.   Yes [provider]  Calcium Carb-Cholecalciferol (CALCIUM + D3) 600-200 MG-UNIT TABS Take by mouth.   Yes [provider]  COLLAGEN PO Take by mouth. Collagen  protein powder mixed with coffee daily in AM   Yes [provider]  fluconazole (DIFLUCAN) 200 MG tablet Take 1 tablet (200 mg total) by mouth daily for 2 doses. 02/03/21 02/05/21 Yes Margarette Canada, NP  lisinopril (ZESTRIL) 10 MG tablet TAKE 1 TABLET(10 MG) BY MOUTH DAILY 09/03/20  Yes Glean Hess, MD  Multiple Vitamins-Minerals (MULTIVITAMIN ADULTS 50+ PO) Take by mouth.   Yes [provider]  nitrofurantoin, macrocrystal-monohydrate, (MACROBID) 100 MG capsule Take 1 capsule (100 mg total) by mouth 2 (two) times daily. 02/03/21  Yes Margarette Canada, NP  oxybutynin (DITROPAN-XL) 5 MG 24 hr tablet TAKE 1 TABLET(5 MG) BY MOUTH AT BEDTIME 06/08/20  Yes Glean Hess, MD  phenazopyridine (PYRIDIUM) 200 MG tablet Take 1 tablet (200 mg total) by mouth 3 (three) times daily. 02/03/21  Yes Margarette Canada, NP  aspirin 81 MG chewable tablet 324 mg.     [provider]  meloxicam (MOBIC) 15 MG tablet meloxicam 15 mg tablet    [provider]  OVER THE COUNTER MEDICATION Lectin shield gundry supplement BID    [provider]  OVER THE COUNTER MEDICATION Bio complete 3 probiotic gundry supply    [provider]    Family History Family History  Problem Relation Age of Onset   Heart failure Mother    Diabetes Father    Heart failure Father    Breast cancer Neg Hx     Social History Social  History   Tobacco Use   Smoking status: Never   Smokeless tobacco: Never   Tobacco comments:    smoking cessation materials not required  Vaping Use   Vaping Use: Never used  Substance Use Topics   Alcohol use: Yes    Alcohol/week: 1.0 standard drink    Types: 1 Glasses of wine per week    Comment: occasional   Drug use: No     Allergies   Codeine, Iodine, Morphine sulfate, Shellfish allergy, and Sulfa antibiotics   Review of Systems Review of Systems  Constitutional:  Negative for fever.  Gastrointestinal:  Negative for abdominal pain.   Genitourinary:  Positive for dysuria, frequency and urgency. Negative for hematuria, vaginal discharge and vaginal pain.  Musculoskeletal:  Negative for back pain.    Physical Exam Triage Vital Signs ED Triage Vitals [02/03/21 0808]  Enc Vitals Group     BP (!) 149/74     Pulse Rate 71     Resp 18     Temp 97.9 F (36.6 C)     Temp Source Oral     SpO2 99 %     Weight 180 lb 8.9 oz (81.9 kg)     Height 5' (1.524 m)     Head Circumference      Peak Flow      Pain Score 0     Pain Loc      Pain Edu?      Excl. in [place]?    No data found.  Updated Vital Signs BP (!) 149/74 (BP Location: Left Arm)    Pulse 71    Temp 97.9 F (36.6 C) (Oral)    Resp 18    Ht 5' (1.524 m)    Wt 180 lb 8.9 oz (81.9 kg)    SpO2 99%    BMI 35.26 kg/m   Visual Acuity Right Eye Distance:   Left Eye Distance:   Bilateral Distance:    Right Eye Near:   Left Eye Near:    Bilateral Near:     Physical Exam Vitals and nursing note reviewed.  Constitutional:      General: She is not in acute distress.    Appearance: Normal appearance. She is not ill-appearing.  HENT:     Head: Normocephalic and atraumatic.  Cardiovascular:     Rate and Rhythm: Normal rate and regular rhythm.     Pulses: Normal pulses.     Heart sounds: Normal heart sounds. No murmur heard.   No friction rub. No gallop.  Pulmonary:     Effort: Pulmonary effort is normal.     Breath sounds: Normal breath sounds. No wheezing, rhonchi or rales.  Abdominal:     Palpations: Abdomen is soft.     Tenderness: There is no abdominal tenderness. There is no right CVA tenderness or left CVA tenderness.  Skin:    General: Skin is warm and dry.     Capillary Refill: Capillary refill takes less than 2 seconds.     Findings: No erythema or rash.  Neurological:     General: No focal deficit present.     Mental Status: She is alert and oriented to person, place, and time.  Psychiatric:        Mood and Affect: Mood normal.         Behavior: Behavior normal.        Thought Content: Thought content normal.        Judgment: Judgment normal.  UC Treatments / Results  Labs (all labs ordered are listed, but only abnormal results are displayed) Labs Reviewed  WET PREP, GENITAL - Abnormal; Notable for the following components:      Result Value   WBC, Wet Prep HPF POC <10 (*)    All other components within normal limits  URINALYSIS, ROUTINE W REFLEX MICROSCOPIC - Abnormal; Notable for the following components:   Color, Urine AMBER (*)    APPearance HAZY (*)    Glucose, UA 100 (*)    Hgb urine dipstick MODERATE (*)    Protein, ur 30 (*)    Nitrite POSITIVE (*)    Leukocytes,Ua SMALL (*)    All other components within normal limits  URINALYSIS, MICROSCOPIC (REFLEX) - Abnormal; Notable for the following components:   Bacteria, UA MANY (*)    All other components within normal limits  URINE CULTURE    EKG   Radiology No results found.  Procedures Procedures (including critical care time)  Medications Ordered in UC Medications - No data to display  Initial Impression / Assessment and Plan / UC Course  I have reviewed the triage vital signs and the nursing notes.  Pertinent labs & imaging results that were available during my care of the patient were reviewed by me and considered in my medical decision making (see chart for details).  Patient is a nontoxic-appearing 81 year old female here for evaluation of painful urination as outlined in HPI above.  Patient's physical exam reveals a benign cardiopulmonary exam with clear lung sounds in all fields.  No CVA tenderness on exam.  Abdomen is soft and nontender.  Patient has just finished a 10-day course of amoxicillin for dental infection 8 days prior to onset of symptoms.  She denies any vaginal discharge or itching but I am suspicious that she may have a vaginal yeast infection.  Urinalysis was collected at triage and is pending.  Will order vaginal wet prep  to look for the presence of yeast.  Vaginal wet prep is negative for the presence of yeast or clue cells.  Urinalysis shows hazy appearance amber-colored urine with 100 glucose, moderate hemoglobin, 30 protein, nitrate positive with small leukocyte Estrace.  Many bacteria with greater than 50 WBCs seen on micro.  Also budding yeast and hyphae present.  We will send urine for culture and treat patient for both urinary tract infection as well as a vaginal yeast infection given the budding yeast and hyphae present on the urinalysis.  Unclear as to why the wet prep did not show the presence of any yeast.  Will discharge patient with a diagnosis of UTI as well as vaginal yeast infection and treat with Diflucan and Macrobid.  We will also give Pyridium for urinary discomfort.   Final Clinical Impressions(s) / UC Diagnoses   Final diagnoses:  Acute cystitis with hematuria  Vaginal yeast infection     Discharge Instructions      Take the Macrobid twice daily for 5 days with food for treatment of urinary tract infection.  Use the Pyridium every 8 hours as needed for urinary discomfort.  This will turn your urine a bright red-orange.  Increase your oral fluid intake so that you increase your urine production and or flushing your urinary system.  Take an over-the-counter probiotic, such as Culturelle-Align-Activia, 1 hour after each dose of antibiotic to prevent diarrhea or yeast infections from forming.  We will culture urine and change the antibiotics if necessary.  Your urinalysis also showed the presence  of yeast.  Take the Diflucan once now and repeat in 7 days if you are still experiencing symptoms.  Return for reevaluation, or see your primary care provider, for any new or worsening symptoms.      ED Prescriptions     Medication Sig Dispense Auth. Provider   nitrofurantoin, macrocrystal-monohydrate, (MACROBID) 100 MG capsule Take 1 capsule (100 mg total) by mouth 2 (two) times  daily. 10 capsule Margarette Canada, NP   phenazopyridine (PYRIDIUM) 200 MG tablet Take 1 tablet (200 mg total) by mouth 3 (three) times daily. 6 tablet Margarette Canada, NP   fluconazole (DIFLUCAN) 200 MG tablet Take 1 tablet (200 mg total) by mouth daily for 2 doses. 2 tablet Margarette Canada, NP      PDMP not reviewed this encounter.   Margarette Canada, NP 02/03/21 [phone]

## 2021-02-03 NOTE — ED Triage Notes (Signed)
Patient is here for "possible UTI". Just finished oral antibiotics for dental infection. Dysuria often. Taking AZO. No discharge. Symptoms began "last night". No fever.

## 2021-02-04 LAB — URINE CULTURE

## 2021-02-12 DIAGNOSIS — Z8673 Personal history of transient ischemic attack (TIA), and cerebral infarction without residual deficits: Secondary | ICD-10-CM | POA: Diagnosis not present

## 2021-02-12 DIAGNOSIS — Z833 Family history of diabetes mellitus: Secondary | ICD-10-CM | POA: Diagnosis not present

## 2021-02-12 DIAGNOSIS — Z6834 Body mass index (BMI) 34.0-34.9, adult: Secondary | ICD-10-CM | POA: Diagnosis not present

## 2021-02-12 DIAGNOSIS — I10 Essential (primary) hypertension: Secondary | ICD-10-CM | POA: Diagnosis not present

## 2021-02-12 DIAGNOSIS — N3941 Urge incontinence: Secondary | ICD-10-CM | POA: Diagnosis not present

## 2021-02-12 DIAGNOSIS — I739 Peripheral vascular disease, unspecified: Secondary | ICD-10-CM | POA: Diagnosis not present

## 2021-02-12 DIAGNOSIS — E669 Obesity, unspecified: Secondary | ICD-10-CM | POA: Diagnosis not present

## 2021-02-12 DIAGNOSIS — Z91013 Allergy to seafood: Secondary | ICD-10-CM | POA: Diagnosis not present

## 2021-03-04 ENCOUNTER — Other Ambulatory Visit: Payer: Self-pay | Admitting: Internal Medicine

## 2021-03-04 DIAGNOSIS — I10 Essential (primary) hypertension: Secondary | ICD-10-CM

## 2021-03-05 NOTE — Telephone Encounter (Signed)
Requested Prescriptions  Pending Prescriptions Disp Refills   lisinopril (ZESTRIL) 10 MG tablet [Pharmacy Med Name: LISINOPRIL 10MG  TABLETS] 90 tablet 1    Sig: TAKE 1 TABLET(10 MG) BY MOUTH DAILY     Cardiovascular:  ACE Inhibitors Failed - 03/04/2021  3:33 AM      Failed - Cr in normal range and within 180 days    Creatinine  Date Value Ref Range Status  11/20/2011 1.01 0.60 - 1.30 mg/dL Final   Creatinine, Ser  Date Value Ref Range Status  03/13/2020 1.09 (H) 0.57 - 1.00 mg/dL Final         Failed - K in normal range and within 180 days    Potassium  Date Value Ref Range Status  03/13/2020 4.6 3.5 - 5.2 mmol/L Final  11/20/2011 3.9 3.5 - 5.1 mmol/L Final         Failed - Last BP in normal range    BP Readings from Last 1 Encounters:  02/03/21 (!) 149/74         Passed - Patient is not pregnant      Passed - Valid encounter within last 6 months    Recent Outpatient Visits          1 month ago Acute non-recurrent maxillary sinusitis   [facility] Glean Hess, MD   5 months ago Acute cystitis with hematuria   [facility] Glean Hess, MD   11 months ago Annual physical exam   [facility] Glean Hess, MD   1 year ago Annual physical exam   [facility] Glean Hess, MD   2 years ago Essential (primary) hypertension   [facility] Glean Hess, MD      Future Appointments            In 1 week Army Melia Jesse Sans, MD [facility], [facility]

## 2021-03-06 ENCOUNTER — Ambulatory Visit (INDEPENDENT_AMBULATORY_CARE_PROVIDER_SITE_OTHER): Payer: Medicare PPO

## 2021-03-06 DIAGNOSIS — Z0000 Encounter for general adult medical examination without abnormal findings: Secondary | ICD-10-CM | POA: Diagnosis not present

## 2021-03-06 DIAGNOSIS — Z1231 Encounter for screening mammogram for malignant neoplasm of breast: Secondary | ICD-10-CM

## 2021-03-06 NOTE — Progress Notes (Signed)
Subjective:   Maria Gibson is a 81 y.o. female who presents for Medicare Annual (Subsequent) preventive examination.  Virtual Visit via Telephone Note  I connected with  Maria Gibson on 03/06/21 at  8:40 AM EST by telephone and verified that I am speaking with the correct person using two identifiers.  Location: Patient: home Provider: [facility] Persons participating in the virtual visit: patient/Nurse Health Advisor   I discussed the limitations, risks, security and privacy concerns of performing an evaluation and management service by telephone and the availability of in person appointments. The patient expressed understanding and agreed to proceed.  Interactive audio and video telecommunications were attempted between this nurse and patient, however failed, due to patient having technical difficulties OR patient did not have access to video capability.  We continued and completed visit with audio only.  Some vital signs may be absent or patient reported.   Maria Littler, LPN   Review of Systems     Cardiac Risk Factors include: advanced age (>106men, >28 women);hypertension     Objective:    Today's Vitals   03/06/21 0845  PainSc: 0-No pain   There is no height or weight on file to calculate BMI.  Advanced Directives 03/06/2021 02/03/2021 03/05/2020 03/02/2019 06/24/2017 06/16/2016 04/01/2016  Does Patient Have a Medical Advance Directive? Yes Yes Yes Yes Yes Yes Yes  Type of Estate agent of [place];Living will - Healthcare Power of [place];Living will Healthcare Power of [place];Living will Healthcare Power of [place];Living will Healthcare Power of [place];Living will Healthcare Power of [place];Living will  Copy of Healthcare Power of Attorney in Chart? Yes - validated most recent copy scanned in chart (See row information) - Yes - validated most recent copy scanned in chart (See row information) No - copy requested No - copy requested No - copy  requested No - copy requested  Would patient like information on creating a medical advance directive? - - - - - - -    Current Medications (verified) Outpatient Encounter Medications as of 03/06/2021  Medication Sig   aspirin 325 MG EC tablet Take 325 mg by mouth daily.   Calcium Carb-Cholecalciferol (CALCIUM + D3) 600-200 MG-UNIT TABS Take by mouth.   COLLAGEN PO Take by mouth. Collagen protein powder mixed with coffee daily in AM   lisinopril (ZESTRIL) 10 MG tablet TAKE 1 TABLET(10 MG) BY MOUTH DAILY   meloxicam (MOBIC) 15 MG tablet meloxicam 15 mg tablet   Multiple Vitamins-Minerals (MULTIVITAMIN ADULTS 50+ PO) Take by mouth.   OVER THE COUNTER MEDICATION Lectin shield gundry supplement BID   OVER THE COUNTER MEDICATION Bio complete 3 probiotic gundry supply   oxybutynin (DITROPAN-XL) 5 MG 24 hr tablet TAKE 1 TABLET(5 MG) BY MOUTH AT BEDTIME   [DISCONTINUED] aspirin 81 MG chewable tablet 324 mg.    [DISCONTINUED] nitrofurantoin, macrocrystal-monohydrate, (MACROBID) 100 MG capsule Take 1 capsule (100 mg total) by mouth 2 (two) times daily.   [DISCONTINUED] phenazopyridine (PYRIDIUM) 200 MG tablet Take 1 tablet (200 mg total) by mouth 3 (three) times daily.   No facility-administered encounter medications on file as of 03/06/2021.    Allergies (verified) Codeine, Iodine, Morphine sulfate, Shellfish allergy, and Sulfa antibiotics   History: Past Medical History:  Diagnosis Date   Hypertension    Past Surgical History:  Procedure Laterality Date   INTRACAPSULAR CATARACT EXTRACTION Bilateral 2017   KNEE SURGERY     REVERSE TOTAL SHOULDER ARTHROPLASTY Right 07/2018   TOTAL SHOULDER REPLACEMENT Left  TUBAL LIGATION     VARICOSE VEIN SURGERY     Family History  Problem Relation Age of Onset   Heart failure Mother    Diabetes Father    Heart failure Father    Breast cancer Neg Hx    Social History   Socioeconomic History   Marital status: Widowed    Spouse name: Not on  file   Number of children: 2   Years of education: Not on file   Highest education level: 12th grade  Occupational History   Occupation: Retired  Tobacco Use   Smoking status: Never   Smokeless tobacco: Never   Tobacco comments:    smoking cessation materials not required  Vaping Use   Vaping Use: Never used  Substance and Sexual Activity   Alcohol use: Yes    Alcohol/week: 1.0 standard drink    Types: 1 Glasses of wine per week    Comment: occasional   Drug use: No   Sexual activity: Not Currently  Other Topics Concern   Not on file  Social History Narrative   Pt lives alone   Social Determinants of Health   Financial Resource Strain: Low Risk    Difficulty of Paying Living Expenses: Not hard at all  Food Insecurity: No Food Insecurity   Worried About Programme researcher, broadcasting/film/video in the Last Year: Never true   Barista in the Last Year: Never true  Transportation Needs: No Transportation Needs   Lack of Transportation (Medical): No   Lack of Transportation (Non-Medical): No  Physical Activity: Insufficiently Active   Days of Exercise per Week: 3 days   Minutes of Exercise per Session: 30 min  Stress: No Stress Concern Present   Feeling of Stress : Not at all  Social Connections: Moderately Isolated   Frequency of Communication with Friends and Family: More than three times a week   Frequency of Social Gatherings with Friends and Family: Once a week   Attends Religious Services: Never   Database administrator or Organizations: Yes   Attends Engineer, structural: More than 4 times per year   Marital Status: Widowed    Tobacco Counseling Counseling given: Not Answered Tobacco comments: smoking cessation materials not required   Clinical Intake:  Pre-visit preparation completed: Yes  Pain : No/denies pain Pain Score: 0-No pain     Nutritional Risks: None Diabetes: No  How often do you need to have someone help you when you read instructions,  pamphlets, or other written materials from your doctor or pharmacy?: 1 - Never    Interpreter Needed?: No  Information entered by :: Maria Littler LPN   Activities of Daily Living In your present state of health, do you have any difficulty performing the following activities: 03/06/2021 01/15/2021  Hearing? Malvin Johns  Vision? N N  Difficulty concentrating or making decisions? N N  Walking or climbing stairs? N N  Dressing or bathing? N N  Doing errands, shopping? N N  Preparing Food and eating ? N -  Using the Toilet? N -  In the past six months, have you accidently leaked urine? N -  Do you have problems with loss of bowel control? N -  Managing your Medications? N -  Managing your Finances? N -  Housekeeping or managing your Housekeeping? N -  Some recent data might be hidden    Patient Care Team: Reubin Milan, MD as PCP - General (Internal Medicine) Vernie Murders, MD (  Otolaryngology)  Indicate any recent Medical Services you may have received from other than [facility] providers in the past year (date may be approximate).     Assessment:   This is a routine wellness examination for Maria Gibson.  Hearing/Vision screen Hearing Screening - Comments:: Pt has hearing aids since July 2022 from [facility] Vision Screening - Comments:: Sees Dr. Leonard Schwartz for annual eye exams in [facility]  Dietary issues and exercise activities discussed: Current Exercise Habits: Home exercise routine, Type of exercise: walking;Other - see comments (glider), Time (Minutes): 30, Frequency (Times/Week): 3, Weekly Exercise (Minutes/Week): 90, Intensity: Moderate, Exercise limited by: None identified   Goals Addressed             This Visit's Progress    DIET - INCREASE WATER INTAKE   On track    Recommend to drink at least 6-8 8oz glasses of water per day.       Depression Screen PHQ 2/9 Scores 03/06/2021 01/15/2021 09/27/2020 03/13/2020 03/05/2020 03/02/2019 12/23/2018  PHQ - 2 Score 0 0 0 0 0 0 0  PHQ- 9  Score 1 2 0 0 - - 0    Fall Risk Fall Risk  03/06/2021 03/06/2021 01/15/2021 09/27/2020 03/13/2020  Falls in the past year? 0 0 0 0 0  Number falls in past yr: 0 0 0 0 0  Injury with Fall? 0 0 0 0 0  Risk for fall due to : No Fall Risks No Fall Risks No Fall Risks No Fall Risks -  Risk for fall due to: Comment - - - - -  Follow up Falls prevention discussed Falls prevention discussed Falls evaluation completed Falls evaluation completed Falls evaluation completed  Comment - - - - -    FALL RISK PREVENTION PERTAINING TO THE HOME:  Any stairs in or around the home? Yes  If so, are there any without handrails? No  Home free of loose throw rugs in walkways, pet beds, electrical cords, etc? Yes  Adequate lighting in your home to reduce risk of falls? Yes   ASSISTIVE DEVICES UTILIZED TO PREVENT FALLS:  Life alert? No  Use of a cane, walker or w/c? No  Grab bars in the bathroom? Yes  Shower chair or bench in shower? No  Elevated toilet seat or a handicapped toilet? Yes   TIMED UP AND GO:  Was the test performed? No . Telephonic visit.   Cognitive Function: Normal cognitive status assessed by direct observation by this Nurse Health Advisor. No abnormalities found.       6CIT Screen 03/02/2019 06/24/2017 06/16/2016 04/01/2016  What Year? 0 points 0 points 0 points 0 points  What month? 0 points 0 points 0 points 0 points  What time? 0 points 0 points 0 points 0 points  Count back from 20 0 points 0 points 0 points 0 points  Months in reverse 0 points 0 points 0 points 0 points  Repeat phrase 0 points 0 points 0 points 0 points  Total Score 0 0 0 0    Immunizations Immunization History  Administered Date(s) Administered   Fluad Quad(high Dose 65+) 10/09/2019   Influenza, High Dose Seasonal PF 10/06/2017, 10/27/2018, 10/08/2020   Influenza,inj,quad, With Preservative 09/07/2015   Influenza-Unspecified 10/07/2014, 09/06/2016, 10/27/2018   Moderna SARS-COV2 Booster Vaccination 07/04/2020,  09/17/2020   PFIZER(Purple Top)SARS-COV-2 Vaccination 02/04/2019, 02/28/2019, 10/09/2019   Pneumococcal Conjugate-13 11/21/2013   Pneumococcal Polysaccharide-23 01/07/2006, 03/06/2015   Pneumococcal-Unspecified 03/06/2015   Tdap 09/17/2020   Zoster Recombinat (Shingrix) 04/01/2016,  06/16/2016   Zoster, Live 01/08/2007    TDAP status: Up to date  Flu Vaccine status: Up to date  Pneumococcal vaccine status: Up to date  Covid-19 vaccine status: Completed vaccines  Qualifies for Shingles Vaccine? Yes   Zostavax completed Yes   Shingrix Completed?: Yes  Screening Tests Health Maintenance  Topic Date Due   COVID-19 Vaccine (4 - Booster for Pfizer series) 11/12/2020   MAMMOGRAM  03/22/2021   TETANUS/TDAP  09/18/2030   Pneumonia Vaccine 5265+ Years old  Completed   INFLUENZA VACCINE  Completed   DEXA SCAN  Completed   Zoster Vaccines- Shingrix  Completed   HPV VACCINES  Aged Out    Health Maintenance  Health Maintenance Due  Topic Date Due   COVID-19 Vaccine (4 - Booster for Pfizer series) 11/12/2020    Colorectal cancer screening: No longer required.   Mammogram status: Completed 03/22/20. Repeat every year. Ordered today.   Bone Density status: Completed 03/22/20. Results reflect: Bone density results: NORMAL. Repeat every 2 years.  Lung Cancer Screening: (Low Dose CT Chest recommended if Age 35-80 years, 30 pack-year currently smoking OR have quit w/in 15years.) does not qualify.   Additional Screening:  Hepatitis C Screening: does not qualify  Vision Screening: Recommended annual ophthalmology exams for early detection of glaucoma and other disorders of the eye. Is the patient up to date with their annual eye exam?  Yes  Who is the provider or what is the name of the office in which the patient attends annual eye exams? Dr. [facility].   Dental Screening: Recommended annual dental exams for proper oral hygiene  Community Resource Referral / Chronic Care  Management: CRR required this visit?  No   CCM required this visit?  No      Plan:     I have personally reviewed and noted the following in the patients chart:   Medical and social history Use of alcohol, tobacco or illicit drugs  Current medications and supplements including opioid prescriptions.  Functional ability and status Nutritional status Physical activity Advanced directives List of other physicians Hospitalizations, surgeries, and ER visits in previous 12 months Vitals Screenings to include cognitive, depression, and falls Referrals and appointments  In addition, I have reviewed and discussed with patient certain preventive protocols, quality metrics, and best practice recommendations. A written personalized care plan for preventive services as well as general preventive health recommendations were provided to patient.     Maria LittlerKasey Jency Schnieders, LPN   9/8/11913/01/2021   Nurse Notes: none

## 2021-03-06 NOTE — Patient Instructions (Signed)
Maria Gibson , Thank you for taking time to come for your Medicare Wellness Visit. I appreciate your ongoing commitment to your health goals. Please review the following plan we discussed and let me know if I can assist you in the future.   Screening recommendations/referrals: Colonoscopy: no longer required Mammogram: done 03/22/20 Bone Density: done 03/22/20 Recommended yearly ophthalmology/optometry visit for glaucoma screening and checkup Recommended yearly dental visit for hygiene and checkup  Vaccinations: Influenza vaccine: done 10/08/20 Pneumococcal vaccine: done 03/06/15 Tdap vaccine: done 09/17/20 Shingles vaccine: done 04/01/16 & 06/16/16   Covid-19:done 02/04/19, 02/28/19, 10/09/19, 07/04/20 & 09/17/20  Conditions/risks identified: Keep up the great work!  Next appointment: Follow up in one year for your annual wellness visit    Preventive Care 65 Years and Older, Female Preventive care refers to lifestyle choices and visits with your health care provider that can promote health and wellness. What does preventive care include? A yearly physical exam. This is also called an annual well check. Dental exams once or twice a year. Routine eye exams. Ask your health care provider how often you should have your eyes checked. Personal lifestyle choices, including: Daily care of your teeth and gums. Regular physical activity. Eating a healthy diet. Avoiding tobacco and drug use. Limiting alcohol use. Practicing safe sex. Taking low-dose aspirin every day. Taking vitamin and mineral supplements as recommended by your health care provider. What happens during an annual well check? The services and screenings done by your health care provider during your annual well check will depend on your age, overall health, lifestyle risk factors, and family history of disease. Counseling  Your health care provider may ask you questions about your: Alcohol use. Tobacco use. Drug use. Emotional  well-being. Home and relationship well-being. Sexual activity. Eating habits. History of falls. Memory and ability to understand (cognition). Work and work Astronomer. Reproductive health. Screening  You may have the following tests or measurements: Height, weight, and BMI. Blood pressure. Lipid and cholesterol levels. These may be checked every 5 years, or more frequently if you are over 68 years old. Skin check. Lung cancer screening. You may have this screening every year starting at age 26 if you have a 30-pack-year history of smoking and currently smoke or have quit within the past 15 years. Fecal occult blood test (FOBT) of the stool. You may have this test every year starting at age 44. Flexible sigmoidoscopy or colonoscopy. You may have a sigmoidoscopy every 5 years or a colonoscopy every 10 years starting at age 28. Hepatitis C blood test. Hepatitis B blood test. Sexually transmitted disease (STD) testing. Diabetes screening. This is done by checking your blood sugar (glucose) after you have not eaten for a while (fasting). You may have this done every 1-3 years. Bone density scan. This is done to screen for osteoporosis. You may have this done starting at age 80. Mammogram. This may be done every 1-2 years. Talk to your health care provider about how often you should have regular mammograms. Talk with your health care provider about your test results, treatment options, and if necessary, the need for more tests. Vaccines  Your health care provider may recommend certain vaccines, such as: Influenza vaccine. This is recommended every year. Tetanus, diphtheria, and acellular pertussis (Tdap, Td) vaccine. You may need a Td booster every 10 years. Zoster vaccine. You may need this after age 68. Pneumococcal 13-valent conjugate (PCV13) vaccine. One dose is recommended after age 56. Pneumococcal polysaccharide (PPSV23) vaccine. One dose is  recommended after age 54. Talk to your  health care provider about which screenings and vaccines you need and how often you need them. This information is not intended to replace advice given to you by your health care provider. Make sure you discuss any questions you have with your health care provider. Document Released: 01/19/2015 Document Revised: 09/12/2015 Document Reviewed: 10/24/2014 Elsevier Interactive Patient Education  2017 [place] Prevention in the Home Falls can cause injuries. They can happen to people of all ages. There are many things you can do to make your home safe and to help prevent falls. What can I do on the outside of my home? Regularly fix the edges of walkways and driveways and fix any cracks. Remove anything that might make you trip as you walk through a door, such as a raised step or threshold. Trim any bushes or trees on the path to your home. Use bright outdoor lighting. Clear any walking paths of anything that might make someone trip, such as rocks or tools. Regularly check to see if handrails are loose or broken. Make sure that both sides of any steps have handrails. Any raised decks and porches should have guardrails on the edges. Have any leaves, snow, or ice cleared regularly. Use sand or salt on walking paths during winter. Clean up any spills in your garage right away. This includes oil or grease spills. What can I do in the bathroom? Use night lights. Install grab bars by the toilet and in the tub and shower. Do not use towel bars as grab bars. Use non-skid mats or decals in the tub or shower. If you need to sit down in the shower, use a plastic, non-slip stool. Keep the floor dry. Clean up any water that spills on the floor as soon as it happens. Remove soap buildup in the tub or shower regularly. Attach bath mats securely with double-sided non-slip rug tape. Do not have throw rugs and other things on the floor that can make you trip. What can I do in the bedroom? Use night  lights. Make sure that you have a light by your bed that is easy to reach. Do not use any sheets or blankets that are too big for your bed. They should not hang down onto the floor. Have a firm chair that has side arms. You can use this for support while you get dressed. Do not have throw rugs and other things on the floor that can make you trip. What can I do in the kitchen? Clean up any spills right away. Avoid walking on wet floors. Keep items that you use a lot in easy-to-reach places. If you need to reach something above you, use a strong step stool that has a grab bar. Keep electrical cords out of the way. Do not use floor polish or wax that makes floors slippery. If you must use wax, use non-skid floor wax. Do not have throw rugs and other things on the floor that can make you trip. What can I do with my stairs? Do not leave any items on the stairs. Make sure that there are handrails on both sides of the stairs and use them. Fix handrails that are broken or loose. Make sure that handrails are as long as the stairways. Check any carpeting to make sure that it is firmly attached to the stairs. Fix any carpet that is loose or worn. Avoid having throw rugs at the top or bottom of the stairs. If  you do have throw rugs, attach them to the floor with carpet tape. Make sure that you have a light switch at the top of the stairs and the bottom of the stairs. If you do not have them, ask someone to add them for you. What else can I do to help prevent falls? Wear shoes that: Do not have high heels. Have rubber bottoms. Are comfortable and fit you well. Are closed at the toe. Do not wear sandals. If you use a stepladder: Make sure that it is fully opened. Do not climb a closed stepladder. Make sure that both sides of the stepladder are locked into place. Ask someone to hold it for you, if possible. Clearly mark and make sure that you can see: Any grab bars or handrails. First and last  steps. Where the edge of each step is. Use tools that help you move around (mobility aids) if they are needed. These include: Canes. Walkers. Scooters. Crutches. Turn on the lights when you go into a dark area. Replace any light bulbs as soon as they burn out. Set up your furniture so you have a clear path. Avoid moving your furniture around. If any of your floors are uneven, fix them. If there are any pets around you, be aware of where they are. Review your medicines with your doctor. Some medicines can make you feel dizzy. This can increase your chance of falling. Ask your doctor what other things that you can do to help prevent falls. This information is not intended to replace advice given to you by your health care provider. Make sure you discuss any questions you have with your health care provider. Document Released: 10/19/2008 Document Revised: 05/31/2015 Document Reviewed: 01/27/2014 Elsevier Interactive Patient Education  2017 [employer].

## 2021-03-15 ENCOUNTER — Ambulatory Visit (INDEPENDENT_AMBULATORY_CARE_PROVIDER_SITE_OTHER): Payer: Medicare PPO | Admitting: Internal Medicine

## 2021-03-15 ENCOUNTER — Encounter: Payer: Self-pay | Admitting: Internal Medicine

## 2021-03-15 ENCOUNTER — Other Ambulatory Visit: Payer: Self-pay

## 2021-03-15 VITALS — BP 120/80 | HR 78 | Ht 64.0 in | Wt 174.0 lb

## 2021-03-15 DIAGNOSIS — Z1231 Encounter for screening mammogram for malignant neoplasm of breast: Secondary | ICD-10-CM

## 2021-03-15 DIAGNOSIS — Z6829 Body mass index (BMI) 29.0-29.9, adult: Secondary | ICD-10-CM | POA: Diagnosis not present

## 2021-03-15 DIAGNOSIS — E781 Pure hyperglyceridemia: Secondary | ICD-10-CM

## 2021-03-15 DIAGNOSIS — I10 Essential (primary) hypertension: Secondary | ICD-10-CM | POA: Diagnosis not present

## 2021-03-15 DIAGNOSIS — K219 Gastro-esophageal reflux disease without esophagitis: Secondary | ICD-10-CM | POA: Diagnosis not present

## 2021-03-15 DIAGNOSIS — Z0000 Encounter for general adult medical examination without abnormal findings: Secondary | ICD-10-CM

## 2021-03-15 LAB — POCT URINALYSIS DIPSTICK
Bilirubin, UA: NEGATIVE
Blood, UA: NEGATIVE
Glucose, UA: NEGATIVE
Ketones, UA: NEGATIVE
Leukocytes, UA: NEGATIVE
Nitrite, UA: NEGATIVE
Protein, UA: NEGATIVE
Spec Grav, UA: 1.01 (ref 1.010–1.025)
Urobilinogen, UA: 0.2 E.U./dL
pH, UA: 6 (ref 5.0–8.0)

## 2021-03-15 NOTE — Progress Notes (Signed)
Date:  03/15/2021   Name:  Maria Gibson   DOB:  01-17-1940   MRN:  [id number]   Chief Complaint: Annual Exam (Breast Exam. No Pap.) Maria Gibson is a 81 y.o. female who presents today for her Complete Annual Exam. She feels well. She reports exercising a machine. She reports she is sleeping well. Breast complaints none. She continues to lose weight steadily with [employer].  Mammogram: 03/2020 DEXA: 03/2020 Normal Colonoscopy: none  Immunization History  Administered Date(s) Administered   Fluad Quad(high Dose 65+) 10/09/2019   Influenza, High Dose Seasonal PF 10/06/2017, 10/27/2018, 10/08/2020   Influenza,inj,quad, With Preservative 09/07/2015   Influenza-Unspecified 10/07/2014, 09/06/2016, 10/27/2018   Moderna SARS-COV2 Booster Vaccination 07/04/2020, 09/17/2020   PFIZER(Purple Top)SARS-COV-2 Vaccination 02/04/2019, 02/28/2019, 10/09/2019   Pneumococcal Conjugate-13 11/21/2013   Pneumococcal Polysaccharide-23 01/07/2006, 03/06/2015   Pneumococcal-Unspecified 03/06/2015   Tdap 09/17/2020   Zoster Recombinat (Shingrix) 04/01/2016, 06/16/2016   Zoster, Live 01/08/2007    Hypertension This is a chronic problem. The problem is controlled. Pertinent negatives include no chest pain, headaches, palpitations or shortness of breath. Past treatments include ACE inhibitors. The current treatment provides significant improvement. Hypertensive end-organ damage includes kidney disease and CVA. There is no history of CAD/MI.  She had a home visit from insurance nurse and PAD screen was abnormal.  Lab Results  Component Value Date   NA 140 03/13/2020   K 4.6 03/13/2020   CO2 25 03/13/2020   GLUCOSE 97 03/13/2020   BUN 16 03/13/2020   CREATININE 1.09 (H) 03/13/2020   CALCIUM 9.4 03/13/2020   EGFR 51 (L) 03/13/2020   GFRNONAA 49 (L) 03/09/2019   Lab Results  Component Value Date   CHOL 190 03/13/2020   HDL 74 03/13/2020   LDLCALC 90 03/13/2020   TRIG 155 (H) 03/13/2020    CHOLHDL 2.6 03/13/2020   Lab Results  Component Value Date   TSH 2.580 03/13/2020   No results found for: HGBA1C Lab Results  Component Value Date   WBC 7.2 03/13/2020   HGB 16.6 (H) 03/13/2020   HCT 48.6 (H) 03/13/2020   MCV 95 03/13/2020   PLT 225 03/13/2020   Lab Results  Component Value Date   ALT 21 03/13/2020   AST 21 03/13/2020   ALKPHOS 62 03/13/2020   BILITOT 0.5 03/13/2020   No results found for: 25OHVITD2, 25OHVITD3, VD25OH   Review of Systems  Constitutional:  Negative for chills, fatigue and fever.  HENT:  Negative for congestion, hearing loss, tinnitus, trouble swallowing and voice change.   Eyes:  Negative for visual disturbance.  Respiratory:  Negative for cough, chest tightness, shortness of breath and wheezing.   Cardiovascular:  Negative for chest pain, palpitations and leg swelling.  Gastrointestinal:  Negative for abdominal pain, constipation, diarrhea and vomiting.  Endocrine: Negative for polydipsia and polyuria.  Genitourinary:  Negative for dysuria, frequency, genital sores, vaginal bleeding and vaginal discharge.  Musculoskeletal:  Positive for arthralgias and gait problem. Negative for joint swelling.  Skin:  Negative for color change and rash.  Neurological:  Negative for dizziness, tremors, light-headedness and headaches.  Hematological:  Negative for adenopathy. Does not bruise/bleed easily.  Psychiatric/Behavioral:  Negative for dysphoric mood and sleep disturbance. The patient is not nervous/anxious.    Patient Active Problem List   Diagnosis Date Noted   Artificial knee joint present 12/23/2018   Obesity, morbid ([place]) 03/08/2018   Osteoarthritis of right shoulder 03/06/2017   Arthritis of right knee 04/15/2016   Blepharoptosis,  acquired, left 04/15/2016   OA (osteoarthritis) of ankle 10/17/2015   Essential (primary) hypertension 12/01/2014   GERD without esophagitis 12/01/2014   Hypertriglyceridemia 12/01/2014   Degenerative arthritis  of finger 12/01/2014   Urge incontinence of urine 12/01/2014   Sequelae of cerebral infarction 12/01/2014    Allergies  Allergen Reactions   Codeine    Iodine    Morphine Sulfate    Shellfish Allergy    Sulfa Antibiotics Nausea And Vomiting    Past Surgical History:  Procedure Laterality Date   INTRACAPSULAR CATARACT EXTRACTION Bilateral 2017   KNEE SURGERY     REVERSE TOTAL SHOULDER ARTHROPLASTY Right 07/2018   TOTAL SHOULDER REPLACEMENT Left    TUBAL LIGATION     VARICOSE VEIN SURGERY      Social History   Tobacco Use   Smoking status: Never   Smokeless tobacco: Never   Tobacco comments:    smoking cessation materials not required  Vaping Use   Vaping Use: Never used  Substance Use Topics   Alcohol use: Yes    Alcohol/week: 1.0 standard drink    Types: 1 Glasses of wine per week    Comment: occasional   Drug use: No     Medication list has been reviewed and updated.  Current Meds  Medication Sig   aspirin 325 MG EC tablet Take 325 mg by mouth daily.   Calcium Carb-Cholecalciferol (CALCIUM + D3) 600-200 MG-UNIT TABS Take by mouth.   COLLAGEN PO Take by mouth. Collagen protein powder mixed with coffee daily in AM   lisinopril (ZESTRIL) 10 MG tablet TAKE 1 TABLET(10 MG) BY MOUTH DAILY   meloxicam (MOBIC) 15 MG tablet meloxicam 15 mg tablet   Multiple Vitamins-Minerals (MULTIVITAMIN ADULTS 50+ PO) Take by mouth.   OVER THE COUNTER MEDICATION Lectin shield gundry supplement BID   OVER THE COUNTER MEDICATION Bio complete 3 probiotic gundry supply   oxybutynin (DITROPAN-XL) 5 MG 24 hr tablet TAKE 1 TABLET(5 MG) BY MOUTH AT BEDTIME    PHQ 2/9 Scores 03/15/2021 03/06/2021 01/15/2021 09/27/2020  PHQ - 2 Score 0 0 0 0  PHQ- 9 Score 2 1 2  0    GAD 7 : Generalized Anxiety Score 03/15/2021 01/15/2021 09/27/2020 03/13/2020  Nervous, Anxious, on Edge 0 0 0 0  Control/stop worrying 0 0 0 0  Worry too much - different things 0 0 0 0  Trouble relaxing 0 0 0 0  Restless 0 0 0 0   Easily annoyed or irritable 0 0 0 0  Afraid - awful might happen 0 0 0 0  Total GAD 7 Score 0 0 0 0  Anxiety Difficulty Not difficult at all Not difficult at all - Not difficult at all    BP Readings from Last 3 Encounters:  03/15/21 120/80  02/03/21 (!) 149/74  01/15/21 130/90    Physical Exam Vitals and nursing note reviewed.  Constitutional:      General: She is not in acute distress.    Appearance: She is well-developed.  HENT:     Head: Normocephalic and atraumatic.     Right Ear: Tympanic membrane and ear canal normal.     Left Ear: Tympanic membrane and ear canal normal.     Nose:     Right Sinus: No maxillary sinus tenderness.     Left Sinus: No maxillary sinus tenderness.  Eyes:     General: No scleral icterus.       Right eye: No discharge.  Left eye: No discharge.     Conjunctiva/sclera: Conjunctivae normal.  Neck:     Thyroid: No thyromegaly.     Vascular: No carotid bruit.  Cardiovascular:     Rate and Rhythm: Normal rate and regular rhythm.     Pulses: Normal pulses.     Heart sounds: Normal heart sounds.  Pulmonary:     Effort: Pulmonary effort is normal. No respiratory distress.     Breath sounds: No wheezing.  Chest:  Breasts:    Right: No mass, nipple discharge, skin change or tenderness.     Left: No mass, nipple discharge, skin change or tenderness.  Abdominal:     General: Bowel sounds are normal.     Palpations: Abdomen is soft.     Tenderness: There is no abdominal tenderness.  Musculoskeletal:     Cervical back: Normal range of motion. No erythema.     Right lower leg: No edema.     Left lower leg: No edema.  Lymphadenopathy:     Cervical: No cervical adenopathy.  Skin:    General: Skin is warm and dry.     Findings: No rash.  Neurological:     Mental Status: She is alert and oriented to person, place, and time.     Cranial Nerves: No cranial nerve deficit.     Sensory: No sensory deficit.     Deep Tendon Reflexes: Reflexes  are normal and symmetric.  Psychiatric:        Attention and Perception: Attention normal.        Mood and Affect: Mood normal.    Wt Readings from Last 3 Encounters:  03/15/21 174 lb (78.9 kg)  02/03/21 180 lb 8.9 oz (81.9 kg)  01/15/21 180 lb 9.6 oz (81.9 kg)    BP 120/80    Pulse 78    Ht 5' 4"  (1.626 m)    Wt 174 lb (78.9 kg)    SpO2 94%    BMI 29.87 kg/m   Assessment and Plan: 1. Annual physical exam Normal exam Up to date on screenings and immunizations. Continue [employer] and weight loss. - Hemoglobin A1c  2. Encounter for screening mammogram for breast cancer Schedule at [facility]  3. Essential (primary) hypertension Clinically stable exam with well controlled BP. Tolerating medications without side effects at this time. Pt to continue current regimen and low sodium diet; benefits of regular exercise as able discussed. - Comprehensive metabolic panel - TSH - POCT urinalysis dipstick  4. GERD without esophagitis Not requiring any prescription medication at this time. - CBC with Differential/Platelet  5. Hypertriglyceridemia Check labs; continue diet changes continue - Lipid panel  6. BMI 29.0-29.9,adult Improving with dietary changes   Partially dictated using Editor, commissioning. Any errors are unintentional.  Halina Maidens, MD [facility]  03/15/2021

## 2021-03-16 LAB — COMPREHENSIVE METABOLIC PANEL
ALT: 18 IU/L (ref 0–32)
AST: 20 IU/L (ref 0–40)
Albumin/Globulin Ratio: 1.7 (ref 1.2–2.2)
Albumin: 4.3 g/dL (ref 3.6–4.6)
Alkaline Phosphatase: 72 IU/L (ref 44–121)
BUN/Creatinine Ratio: 28 (ref 12–28)
BUN: 22 mg/dL (ref 8–27)
Bilirubin Total: 0.4 mg/dL (ref 0.0–1.2)
CO2: 26 mmol/L (ref 20–29)
Calcium: 10.1 mg/dL (ref 8.7–10.3)
Chloride: 101 mmol/L (ref 96–106)
Creatinine, Ser: 0.79 mg/dL (ref 0.57–1.00)
Globulin, Total: 2.6 g/dL (ref 1.5–4.5)
Glucose: 83 mg/dL (ref 70–99)
Potassium: 4.9 mmol/L (ref 3.5–5.2)
Sodium: 142 mmol/L (ref 134–144)
Total Protein: 6.9 g/dL (ref 6.0–8.5)
eGFR: 75 mL/min/{1.73_m2} (ref >59)

## 2021-03-16 LAB — LIPID PANEL
Chol/HDL Ratio: 2.7 ratio (ref 0.0–4.4)
Cholesterol, Total: 188 mg/dL (ref 100–199)
HDL: 70 mg/dL (ref >39)
LDL Chol Calc (NIH): 96 mg/dL (ref 0–99)
Triglycerides: 129 mg/dL (ref 0–149)
VLDL Cholesterol Cal: 22 mg/dL (ref 5–40)

## 2021-03-16 LAB — CBC WITH DIFFERENTIAL/PLATELET
Basophils Absolute: 0 10*3/uL (ref 0.0–0.2)
Basos: 1 %
EOS (ABSOLUTE): 0.1 10*3/uL (ref 0.0–0.4)
Eos: 2 %
Hematocrit: 46.3 % (ref 34.0–46.6)
Hemoglobin: 15.9 g/dL (ref 11.1–15.9)
Immature Grans (Abs): 0 10*3/uL (ref 0.0–0.1)
Immature Granulocytes: 0 %
Lymphocytes Absolute: 2.3 10*3/uL (ref 0.7–3.1)
Lymphs: 38 %
MCH: 32.3 pg (ref 26.6–33.0)
MCHC: 34.3 g/dL (ref 31.5–35.7)
MCV: 94 fL (ref 79–97)
Monocytes Absolute: 0.6 10*3/uL (ref 0.1–0.9)
Monocytes: 10 %
Neutrophils Absolute: 2.9 10*3/uL (ref 1.4–7.0)
Neutrophils: 49 %
Platelets: 208 10*3/uL (ref 150–450)
RBC: 4.92 x10E6/uL (ref 3.77–5.28)
RDW: 12.1 % (ref 11.7–15.4)
WBC: 6 10*3/uL (ref 3.4–10.8)

## 2021-03-16 LAB — HEMOGLOBIN A1C
Est. average glucose Bld gHb Est-mCnc: 108 mg/dL
Hgb A1c MFr Bld: 5.4 % (ref 4.8–5.6)

## 2021-03-16 LAB — TSH: TSH: 2.35 u[IU]/mL (ref 0.450–4.500)

## 2021-04-03 ENCOUNTER — Other Ambulatory Visit: Payer: Self-pay

## 2021-04-03 ENCOUNTER — Ambulatory Visit
Admission: RE | Admit: 2021-04-03 | Discharge: 2021-04-03 | Disposition: A | Discharge status: Home / Self Care | Payer: Medicare PPO | Source: Ambulatory Visit | Attending: Internal Medicine | Admitting: Internal Medicine

## 2021-04-03 DIAGNOSIS — Z1231 Encounter for screening mammogram for malignant neoplasm of breast: Secondary | ICD-10-CM | POA: Insufficient documentation

## 2021-04-09 DIAGNOSIS — L821 Other seborrheic keratosis: Secondary | ICD-10-CM | POA: Diagnosis not present

## 2021-04-09 DIAGNOSIS — D485 Neoplasm of uncertain behavior of skin: Secondary | ICD-10-CM | POA: Diagnosis not present

## 2021-04-09 DIAGNOSIS — D1809 Hemangioma of other sites: Secondary | ICD-10-CM | POA: Diagnosis not present

## 2021-04-09 DIAGNOSIS — D225 Melanocytic nevi of trunk: Secondary | ICD-10-CM | POA: Diagnosis not present

## 2021-05-28 DIAGNOSIS — L988 Other specified disorders of the skin and subcutaneous tissue: Secondary | ICD-10-CM | POA: Diagnosis not present

## 2021-05-28 DIAGNOSIS — D235 Other benign neoplasm of skin of trunk: Secondary | ICD-10-CM | POA: Diagnosis not present

## 2021-07-02 ENCOUNTER — Encounter: Payer: Self-pay | Admitting: Internal Medicine

## 2021-07-02 ENCOUNTER — Ambulatory Visit (INDEPENDENT_AMBULATORY_CARE_PROVIDER_SITE_OTHER): Payer: Medicare Other | Admitting: Internal Medicine

## 2021-07-02 VITALS — BP 118/76 | HR 75 | Ht 60.0 in | Wt 168.0 lb

## 2021-07-02 DIAGNOSIS — R300 Dysuria: Secondary | ICD-10-CM | POA: Diagnosis not present

## 2021-07-02 LAB — POCT URINALYSIS DIPSTICK
Bilirubin, UA: NEGATIVE
Blood, UA: NEGATIVE
Glucose, UA: NEGATIVE
Ketones, UA: NEGATIVE
Leukocytes, UA: NEGATIVE
Nitrite, UA: NEGATIVE
Protein, UA: POSITIVE — AB
Spec Grav, UA: 1.025 (ref 1.010–1.025)
Urobilinogen, UA: 0.2 E.U./dL
pH, UA: 6 (ref 5.0–8.0)

## 2021-07-02 MED ORDER — NITROFURANTOIN MONOHYD MACRO 100 MG PO CAPS: 100.0000 mg | ORAL_CAPSULE | Freq: Two times a day (BID) | ORAL | 0 refills | Status: AC

## 2021-08-25 ENCOUNTER — Other Ambulatory Visit: Payer: Self-pay | Admitting: Internal Medicine

## 2021-08-25 DIAGNOSIS — N3941 Urge incontinence: Secondary | ICD-10-CM

## 2021-08-27 NOTE — Telephone Encounter (Signed)
Requested Prescriptions  Pending Prescriptions Disp Refills  . oxybutynin (DITROPAN-XL) 5 MG 24 hr tablet [Pharmacy Med Name: OXYBUTYNIN ER 5MG  TABLETS] 90 tablet 1    Sig: TAKE 1 TABLET(5 MG) BY MOUTH AT BEDTIME     Urology:  Bladder Agents Passed - 08/25/2021  3:34 AM      Passed - Valid encounter within last 12 months    Recent Outpatient Visits          1 month ago Dysuria   [place] [facility] at [facility], [facility], [place]   5 months ago Annual physical exam   [place] [facility] at [facility], [facility], [place]   7 months ago Acute non-recurrent maxillary sinusitis   [place] [facility] at [facility], [facility], [place]   11 months ago Acute cystitis with hematuria   [facility] at [facility], [facility], [place]   1 year ago Annual physical exam   [facility] and [facility] Medicine at [facility], [facility], [place]      Future Appointments            In 6 months Nyoka Cowden, Judithann Graves, MD [facility] and [facility] at [facility], [facility]

## 2021-08-28 ENCOUNTER — Other Ambulatory Visit: Payer: Self-pay | Admitting: Internal Medicine

## 2021-08-28 DIAGNOSIS — I10 Essential (primary) hypertension: Secondary | ICD-10-CM

## 2021-08-28 NOTE — Telephone Encounter (Signed)
Requested Prescriptions  Pending Prescriptions Disp Refills  . lisinopril (ZESTRIL) 10 MG tablet [Pharmacy Med Name: LISINOPRIL 10MG  TABLETS] 90 tablet 0    Sig: TAKE 1 TABLET(10 MG) BY MOUTH DAILY     Cardiovascular:  ACE Inhibitors Passed - 08/28/2021 12:47 PM      Passed - Cr in normal range and within 180 days    Creatinine  Date Value Ref Range Status  11/20/2011 1.01 0.60 - 1.30 mg/dL Final   Creatinine, Ser  Date Value Ref Range Status  03/15/2021 0.79 0.57 - 1.00 mg/dL Final         Passed - K in normal range and within 180 days    Potassium  Date Value Ref Range Status  03/15/2021 4.9 3.5 - 5.2 mmol/L Final  11/20/2011 3.9 3.5 - 5.1 mmol/L Final         Passed - Patient is not pregnant      Passed - Last BP in normal range    BP Readings from Last 1 Encounters:  07/02/21 118/76         Passed - Valid encounter within last 6 months    Recent Outpatient Visits          1 month ago Dysuria   [place] [facility] at [facility], [facility], [place]   5 months ago Annual physical exam   [place] [facility] at [facility], [facility], [place]   7 months ago Acute non-recurrent maxillary sinusitis   [place] [facility] Medicine at [facility], [facility], [place]   11 months ago Acute cystitis with hematuria   [facility] at [facility], [facility], [place]   1 year ago Annual physical exam   [facility] Care and [facility] Medicine at [facility], [facility], [place]      Future Appointments            In 6 months Nyoka Cowden, Judithann Graves, MD [facility] Primary Care and Sports Medicine at [facility], [facility]

## 2021-09-24 ENCOUNTER — Ambulatory Visit (INDEPENDENT_AMBULATORY_CARE_PROVIDER_SITE_OTHER): Payer: Medicare Other | Admitting: Internal Medicine

## 2021-09-24 ENCOUNTER — Encounter: Payer: Self-pay | Admitting: Internal Medicine

## 2021-09-24 VITALS — BP 124/84 | HR 78 | Ht 60.0 in | Wt 166.4 lb

## 2021-09-24 DIAGNOSIS — Z23 Encounter for immunization: Secondary | ICD-10-CM | POA: Diagnosis not present

## 2021-09-24 DIAGNOSIS — M62838 Other muscle spasm: Secondary | ICD-10-CM

## 2021-09-24 MED ORDER — BACLOFEN 10 MG PO TABS: 10.0000 mg | ORAL_TABLET | Freq: Every day | ORAL | 0 refills | Status: DC

## 2021-09-24 NOTE — Progress Notes (Signed)
Date:  09/24/2021   Name:  Maria Gibson   DOB:  [birth year]   MRN:  [id number]   Chief Complaint: Neck Pain  Neck Pain  This is a new problem. The current episode started more than 1 month ago. The pain is present in the right side. The quality of the pain is described as shooting (tingling, numbness). The pain is mild. The symptoms are aggravated by position and twisting. Associated symptoms include numbness (over the right trapezius) and tingling. Pertinent negatives include no chest pain, headaches, trouble swallowing, visual change or weakness. She has tried nothing for the symptoms.    Lab Results  Component Value Date   NA 142 03/15/2021   K 4.9 03/15/2021   CO2 26 03/15/2021   GLUCOSE 83 03/15/2021   BUN 22 03/15/2021   CREATININE 0.79 03/15/2021   CALCIUM 10.1 03/15/2021   EGFR 75 03/15/2021   GFRNONAA 49 (L) 03/09/2019   Lab Results  Component Value Date   CHOL 188 03/15/2021   HDL 70 03/15/2021   LDLCALC 96 03/15/2021   TRIG 129 03/15/2021   CHOLHDL 2.7 03/15/2021   Lab Results  Component Value Date   TSH 2.350 03/15/2021   Lab Results  Component Value Date   HGBA1C 5.4 03/15/2021   Lab Results  Component Value Date   WBC 6.0 03/15/2021   HGB 15.9 03/15/2021   HCT 46.3 03/15/2021   MCV 94 03/15/2021   PLT 208 03/15/2021   Lab Results  Component Value Date   ALT 18 03/15/2021   AST 20 03/15/2021   ALKPHOS 72 03/15/2021   BILITOT 0.4 03/15/2021   No results found for: "25OHVITD2", "25OHVITD3", "VD25OH"   Review of Systems  Constitutional:  Negative for fatigue and unexpected weight change.  HENT:  Negative for nosebleeds and trouble swallowing.   Eyes:  Negative for visual disturbance.  Respiratory:  Negative for cough, chest tightness, shortness of breath and wheezing.   Cardiovascular:  Negative for chest pain.  Gastrointestinal:  Negative for abdominal pain, constipation and diarrhea.  Musculoskeletal:  Positive for gait problem and  neck pain.  Neurological:  Positive for tingling and numbness (over the right trapezius). Negative for dizziness, weakness, light-headedness and headaches.  Psychiatric/Behavioral:  Negative for sleep disturbance.     Patient Active Problem List   Diagnosis Date Noted   Artificial knee joint present 12/23/2018   Osteoarthritis of right shoulder 03/06/2017   Arthritis of right knee 04/15/2016   Blepharoptosis, acquired, left 04/15/2016   OA (osteoarthritis) of ankle 10/17/2015   Essential (primary) hypertension 12/01/2014   GERD without esophagitis 12/01/2014   Hypertriglyceridemia 12/01/2014   Degenerative arthritis of finger 12/01/2014   Urge incontinence of urine 12/01/2014   Sequelae of cerebral infarction 12/01/2014    Allergies  Allergen Reactions   Codeine    Iodine    Morphine Sulfate    Shellfish Allergy    Sulfa Antibiotics Nausea And Vomiting    Past Surgical History:  Procedure Laterality Date   INTRACAPSULAR CATARACT EXTRACTION Bilateral 2017   KNEE SURGERY     REVERSE TOTAL SHOULDER ARTHROPLASTY Right 07/2018   TOTAL SHOULDER REPLACEMENT Left    TUBAL LIGATION     VARICOSE VEIN SURGERY      Social History   Tobacco Use   Smoking status: Never   Smokeless tobacco: Never   Tobacco comments:    smoking cessation materials not required  Vaping Use   Vaping Use: Never used  Substance  Use Topics   Alcohol use: Yes    Alcohol/week: 1.0 standard drink of alcohol    Types: 1 Glasses of wine per week    Comment: occasional   Drug use: No     Medication list has been reviewed and updated.  Current Meds  Medication Sig   aspirin 325 MG EC tablet Take 325 mg by mouth daily.   baclofen (LIORESAL) 10 MG tablet Take 1 tablet (10 mg total) by mouth at bedtime.   Calcium Carb-Cholecalciferol (CALCIUM + D3) 600-200 MG-UNIT TABS Take by mouth.   COLLAGEN PO Take by mouth. Collagen protein powder mixed with coffee daily in AM   lisinopril (ZESTRIL) 10 MG  tablet TAKE 1 TABLET(10 MG) BY MOUTH DAILY   meloxicam (MOBIC) 15 MG tablet meloxicam 15 mg tablet   Multiple Vitamins-Minerals (MULTIVITAMIN ADULTS 50+ PO) Take by mouth.   OVER THE COUNTER MEDICATION Lectin shield gundry supplement BID   OVER THE COUNTER MEDICATION Bio complete 3 probiotic gundry supply   oxybutynin (DITROPAN-XL) 5 MG 24 hr tablet TAKE 1 TABLET(5 MG) BY MOUTH AT BEDTIME   Zinc 20 MG CAPS TAKE 4 CAPSULES BY MOUTH 1 HOUR BEFORE APPOINTMENT       09/24/2021    9:47 AM 07/02/2021   11:12 AM 03/15/2021    9:53 AM 01/15/2021    9:10 AM  GAD 7 : Generalized Anxiety Score  Nervous, Anxious, on Edge 1 0 0 0  Control/stop worrying 0 0 0 0  Worry too much - different things 0 0 0 0  Trouble relaxing 1 0 0 0  Restless 0 0 0 0  Easily annoyed or irritable 0 0 0 0  Afraid - awful might happen 1 0 0 0  Total GAD 7 Score 3 0 0 0  Anxiety Difficulty Not difficult at all Not difficult at all Not difficult at all Not difficult at all       09/24/2021    9:47 AM 07/02/2021   11:11 AM 03/15/2021    9:53 AM  Depression screen PHQ 2/9  Decreased Interest 0 0 0  Down, Depressed, Hopeless 0 0 0  PHQ - 2 Score 0 0 0  Altered sleeping 1 0 2  Tired, decreased energy 1 1 0  Change in appetite 0 0 0  Feeling bad or failure about yourself  0 0 0  Trouble concentrating 0 0 0  Moving slowly or fidgety/restless 0 0 0  Suicidal thoughts 0 0 0  PHQ-9 Score 2 1 2   Difficult doing work/chores Not difficult at all Not difficult at all Not difficult at all    BP Readings from Last 3 Encounters:  09/24/21 124/84  07/02/21 118/76  03/15/21 120/80    Physical Exam Vitals and nursing note reviewed.  Constitutional:      General: She is not in acute distress.    Appearance: Normal appearance. She is well-developed.  HENT:     Head: Normocephalic and atraumatic.  Pulmonary:     Effort: Pulmonary effort is normal. No respiratory distress.  Musculoskeletal:     Cervical back: Spasms and  tenderness present. Decreased range of motion.  Skin:    General: Skin is warm and dry.     Findings: No rash.  Neurological:     Mental Status: She is alert and oriented to person, place, and time.     Sensory: Sensation is intact.     Motor: Motor function is intact.     Coordination: Coordination  is intact.  Psychiatric:        Mood and Affect: Mood normal.        Behavior: Behavior normal.     Wt Readings from Last 3 Encounters:  09/24/21 166 lb 6.4 oz (75.5 kg)  07/02/21 168 lb (76.2 kg)  03/15/21 174 lb (78.9 kg)    BP 124/84 (BP Location: Right Arm, Cuff Size: Normal)   Pulse 78   Ht 5' (1.524 m)   Wt 166 lb 6.4 oz (75.5 kg)   SpO2 97%   BMI 32.50 kg/m   Assessment and Plan: 1. Muscle spasms of neck Recommend heat several times per day with baclofen at bedtime for 7 days. May use bacofen PRN for recurrence. - baclofen (LIORESAL) 10 MG tablet; Take 1 tablet (10 mg total) by mouth at bedtime.  Dispense: 30 each; Refill: 0  2. Need for immunization against influenza - Flu Vaccine QUAD High Dose(Fluad)   Partially dictated using Editor, commissioning. Any errors are unintentional.  Halina Maidens, MD [facility]  09/24/2021

## 2021-09-25 ENCOUNTER — Ambulatory Visit: Payer: Self-pay

## 2021-09-25 ENCOUNTER — Other Ambulatory Visit: Payer: Self-pay | Admitting: Internal Medicine

## 2021-09-25 ENCOUNTER — Encounter: Payer: Self-pay | Admitting: Internal Medicine

## 2021-09-25 NOTE — Telephone Encounter (Signed)
   Chief Complaint: Reaction to Baclofen Symptoms: Hands and abdomen felt swollen. "I can't take that medication." Asking what she can take for the reaction - Benadryl? Frequency: Last night Pertinent Negatives: Patient denies any SOB or chest pain. Disposition: [] ED /[] Urgent Care (no appt availability in office) / [] Appointment(In office/virtual)/ []  [facility]/ [] Home Care/ [] Refused Recommended Disposition /[] [place] [facility]/ [x]  Follow-up with PCP Additional Notes: Please advise pt. States has taken Tramadol in the past and it helped.   Answer Assessment - Initial Assessment Questions 1. NAME of MEDICINE: "What medicine(s) are you calling about?"     Baclofen 2. QUESTION: "What is your question?" (e.g., double dose of medicine, side effect)     Side effects - hands feel swollen, not swollen. Abdomen felt swollen 3. PRESCRIBER: "Who prescribed the medicine?" Reason: if prescribed by specialist, call should be referred to that group.     Dr. Army Melia 4. SYMPTOMS: "Do you have any symptoms?" If Yes, ask: "What symptoms are you having?"  "How bad are the symptoms (e.g., mild, moderate, severe)     Above 5. PREGNANCY:  "Is there any chance that you are pregnant?" "When was your last menstrual period?"     No  Protocols used: Medication Question Call-A-AH

## 2021-09-25 NOTE — Telephone Encounter (Signed)
Pt wants to just try to treat herself at home with a heating pad and will call back if she wants something else sent in.  KP

## 2021-09-25 NOTE — Telephone Encounter (Signed)
Please review. Pt stated she had a reaction to Baclofen.  KP

## 2021-11-24 ENCOUNTER — Other Ambulatory Visit: Payer: Self-pay | Admitting: Internal Medicine

## 2021-11-24 DIAGNOSIS — I10 Essential (primary) hypertension: Secondary | ICD-10-CM

## 2021-11-25 NOTE — Telephone Encounter (Signed)
Requested Prescriptions  Pending Prescriptions Disp Refills   lisinopril (ZESTRIL) 10 MG tablet [Pharmacy Med Name: LISINOPRIL 10MG  TABLETS] 90 tablet 1    Sig: TAKE 1 TABLET(10 MG) BY MOUTH DAILY     Cardiovascular:  ACE Inhibitors Failed - 11/24/2021  3:39 AM      Failed - Cr in normal range and within 180 days    Creatinine  Date Value Ref Range Status  11/20/2011 1.01 0.60 - 1.30 mg/dL Final   Creatinine, Ser  Date Value Ref Range Status  03/15/2021 0.79 0.57 - 1.00 mg/dL Final         Failed - K in normal range and within 180 days    Potassium  Date Value Ref Range Status  03/15/2021 4.9 3.5 - 5.2 mmol/L Final  11/20/2011 3.9 3.5 - 5.1 mmol/L Final         Passed - Patient is not pregnant      Passed - Last BP in normal range    BP Readings from Last 1 Encounters:  09/24/21 124/84         Passed - Valid encounter within last 6 months    Recent Outpatient Visits           2 months ago Muscle spasms of neck   [place] [facility] at [facility], [facility], [place]   4 months ago Dysuria   [place] [facility] at [facility], [facility], [place]   8 months ago Annual physical exam   [place] [facility] at [facility], [facility], [place]   10 months ago Acute non-recurrent maxillary sinusitis    Primary Care and Sports Medicine at [facility], [facility], [place]   1 year ago Acute cystitis with hematuria   [facility] at [facility], [facility], MD       Future Appointments             In 3 months Nyoka Cowden, Judithann Graves, MD [facility] Primary Care and Sports Medicine at [facility], [facility]

## 2021-12-16 DIAGNOSIS — Z961 Presence of intraocular lens: Secondary | ICD-10-CM | POA: Diagnosis not present

## 2022-02-24 ENCOUNTER — Other Ambulatory Visit: Payer: Self-pay | Admitting: Internal Medicine

## 2022-02-24 DIAGNOSIS — Z1231 Encounter for screening mammogram for malignant neoplasm of breast: Secondary | ICD-10-CM

## 2022-02-26 ENCOUNTER — Other Ambulatory Visit: Payer: Self-pay | Admitting: Internal Medicine

## 2022-02-26 DIAGNOSIS — N3941 Urge incontinence: Secondary | ICD-10-CM

## 2022-03-10 ENCOUNTER — Ambulatory Visit: Payer: Medicare PPO

## 2022-03-10 DIAGNOSIS — I10 Essential (primary) hypertension: Secondary | ICD-10-CM | POA: Diagnosis not present

## 2022-03-10 DIAGNOSIS — I739 Peripheral vascular disease, unspecified: Secondary | ICD-10-CM | POA: Diagnosis not present

## 2022-03-10 DIAGNOSIS — Z8673 Personal history of transient ischemic attack (TIA), and cerebral infarction without residual deficits: Secondary | ICD-10-CM | POA: Diagnosis not present

## 2022-03-10 DIAGNOSIS — M1990 Unspecified osteoarthritis, unspecified site: Secondary | ICD-10-CM | POA: Diagnosis not present

## 2022-03-10 DIAGNOSIS — Z6832 Body mass index (BMI) 32.0-32.9, adult: Secondary | ICD-10-CM | POA: Diagnosis not present

## 2022-03-10 DIAGNOSIS — E669 Obesity, unspecified: Secondary | ICD-10-CM | POA: Diagnosis not present

## 2022-03-10 DIAGNOSIS — N393 Stress incontinence (female) (male): Secondary | ICD-10-CM | POA: Diagnosis not present

## 2022-03-10 DIAGNOSIS — Z882 Allergy status to sulfonamides status: Secondary | ICD-10-CM | POA: Diagnosis not present

## 2022-03-10 DIAGNOSIS — Z8249 Family history of ischemic heart disease and other diseases of the circulatory system: Secondary | ICD-10-CM | POA: Diagnosis not present

## 2022-03-12 ENCOUNTER — Ambulatory Visit (INDEPENDENT_AMBULATORY_CARE_PROVIDER_SITE_OTHER): Payer: Medicare PPO

## 2022-03-12 VITALS — BP 120/80 | Ht 60.0 in | Wt 166.6 lb

## 2022-03-12 DIAGNOSIS — Z0000 Encounter for general adult medical examination without abnormal findings: Secondary | ICD-10-CM | POA: Diagnosis not present

## 2022-03-12 NOTE — Progress Notes (Signed)
Subjective:   Sherree Wichman is a 82 y.o. female who presents for Medicare Annual (Subsequent) preventive examination.  Review of Systems     Cardiac Risk Factors include: advanced age (>39mn, >>33women);hypertension     Objective:    Today's Vitals   03/12/22 0916  BP: 120/80  Weight: 166 lb 9.6 oz (75.6 kg)  Height: 5' (1.524 m)   Body mass index is 32.54 kg/m.     03/12/2022    9:25 AM 03/06/2021    8:51 AM 02/03/2021    8:12 AM 03/05/2020    9:04 AM 03/02/2019    8:28 AM 06/24/2017    8:32 AM 06/16/2016    9:13 AM  Advanced Directives  Does Patient Have a Medical Advance Directive? Yes Yes Yes Yes Yes Yes Yes  Type of AParamedicof AMounds ViewLiving will HGlen WhiteLiving will  [place] will [place]Living will HCheyenne WellsLiving will HSawmillLiving will  Does patient want to make changes to medical advance directive? No - Patient declined        Copy of HWandain Chart? Yes - validated most recent copy scanned in chart (See row information) Yes - validated most recent copy scanned in chart (See row information)  Yes - validated most recent copy scanned in chart (See row information) No - copy requested No - copy requested No - copy requested    Current Medications (verified) Outpatient Encounter Medications as of 03/12/2022  Medication Sig   aspirin 325 MG EC tablet Take 325 mg by mouth daily.   Calcium Carb-Cholecalciferol (CALCIUM + D3) 600-200 MG-UNIT TABS Take by mouth.   COLLAGEN PO Take by mouth. Collagen protein powder mixed with coffee daily in AM   lisinopril (ZESTRIL) 10 MG tablet TAKE 1 TABLET(10 MG) BY MOUTH DAILY   meloxicam (MOBIC) 15 MG tablet meloxicam 15 mg tablet   Multiple Vitamins-Minerals (MULTIVITAMIN ADULTS 50+ PO) Take by mouth.   OVER THE COUNTER MEDICATION Lectin shield gundry supplement BID    OVER THE COUNTER MEDICATION Bio complete 3 probiotic gundry supply   Zinc 20 MG CAPS TAKE 4 CAPSULES BY MOUTH 1 HOUR BEFORE APPOINTMENT   oxybutynin (DITROPAN-XL) 5 MG 24 hr tablet TAKE 1 TABLET(5 MG) BY MOUTH AT BEDTIME (Patient not taking: Reported on 03/12/2022)   No facility-administered encounter medications on file as of 03/12/2022.    Allergies (verified) Baclofen, Codeine, Iodine, Morphine sulfate, Shellfish allergy, and Sulfa antibiotics   History: Past Medical History:  Diagnosis Date   Hypertension    Past Surgical History:  Procedure Laterality Date   INTRACAPSULAR CATARACT EXTRACTION Bilateral 2017   KNEE SURGERY     REVERSE TOTAL SHOULDER ARTHROPLASTY Right 07/2018   TOTAL SHOULDER REPLACEMENT Left    TUBAL LIGATION     VARICOSE VEIN SURGERY     Family History  Problem Relation Age of Onset   Heart failure Mother    Diabetes Father    Heart failure Father    Breast cancer Neg Hx    Social History   Socioeconomic History   Marital status: Widowed    Spouse name: Not on file   Number of children: 2   Years of education: Not on file   Highest education level: 12th grade  Occupational History   Occupation: Retired  Tobacco Use   Smoking status: Never   Smokeless tobacco: Never   Tobacco comments:  smoking cessation materials not required  Vaping Use   Vaping Use: Never used  Substance and Sexual Activity   Alcohol use: Yes    Alcohol/week: 1.0 standard drink of alcohol    Types: 1 Glasses of wine per week    Comment: occasional   Drug use: No   Sexual activity: Not Currently  Other Topics Concern   Not on file  Social History Narrative   Pt lives alone   Social Determinants of Health   Financial Resource Strain: Medium Risk (03/12/2022)   Overall Financial Resource Strain (CARDIA)    Difficulty of Paying Living Expenses: Somewhat hard  Food Insecurity: No Food Insecurity (03/12/2022)   Hunger Vital Sign    Worried About Running Out of Food in the  Last Year: Never true    Ran Out of Food in the Last Year: Never true  Transportation Needs: No Transportation Needs (03/12/2022)   PRAPARE - Hydrologist (Medical): No    Lack of Transportation (Non-Medical): No  Physical Activity: Insufficiently Active (03/12/2022)   Exercise Vital Sign    Days of Exercise per Week: 2 days    Minutes of Exercise per Session: 10 min  Stress: Stress Concern Present (03/12/2022)   [place]    Feeling of Stress : To some extent  Social Connections: Moderately Isolated (03/12/2022)   Social Connection and Isolation Panel [NHANES]    Frequency of Communication with Friends and Family: More than three times a week    Frequency of Social Gatherings with Friends and Family: Never    Attends Religious Services: Never    Marine scientist or Organizations: Yes    Attends Music therapist: More than 4 times per year    Marital Status: Widowed    Tobacco Counseling Counseling given: Not Answered Tobacco comments: smoking cessation materials not required   Clinical Intake:  Pre-visit preparation completed: Yes  Pain : No/denies pain     Nutritional Risks: None Diabetes: No  How often do you need to have someone help you when you read instructions, pamphlets, or other written materials from your doctor or pharmacy?: 1 - Never  Diabetic?no  Interpreter Needed?: No  Information entered by :: Kirke Shaggy, LPN   Activities of Daily Living    03/12/2022    9:26 AM 03/15/2021    9:53 AM  In your present state of health, do you have any difficulty performing the following activities:  Hearing? 0 1  Vision? 0 0  Difficulty concentrating or making decisions? 0 0  Walking or climbing stairs? 0 0  Dressing or bathing? 0 0  Doing errands, shopping? 0 0  Preparing Food and eating ? N   Using the Toilet? N   In the past six months, have you  accidently leaked urine? N   Do you have problems with loss of bowel control? N   Managing your Medications? N   Managing your Finances? N   Housekeeping or managing your Housekeeping? N     Patient Care Team: Glean Hess, MD as PCP - General (Internal Medicine) Margaretha Sheffield, MD (Otolaryngology)  Indicate any recent Medical Services you may have received from other than [facility] providers in the past year (date may be approximate).     Assessment:   This is a routine wellness examination for [place].  Hearing/Vision screen Hearing Screening - Comments:: Wears aids Vision Screening - Comments:: Wears glasses-Dr.Scrogs  in [place] issues and exercise activities discussed: Current Exercise Habits: Home exercise routine, Type of exercise: Other - see comments (rowing machine), Time (Minutes): 10, Frequency (Times/Week): 2, Weekly Exercise (Minutes/Week): 20, Intensity: Moderate   Goals Addressed             This Visit's Progress    DIET - EAT MORE FRUITS AND VEGETABLES         Depression Screen    03/12/2022    9:23 AM 09/24/2021    9:47 AM 07/02/2021   11:11 AM 03/15/2021    9:53 AM 03/06/2021    8:50 AM 01/15/2021    9:09 AM 09/27/2020   11:50 AM  PHQ 2/9 Scores  PHQ - 2 Score 0 0 0 0 0 0 0  PHQ- 9 Score 0 '2 1 2 1 2 '$ 0    Fall Risk    03/12/2022    9:26 AM 09/24/2021    9:47 AM 07/02/2021   11:12 AM 03/15/2021    9:53 AM 03/06/2021    8:56 AM  Fall Risk   Falls in the past year? 0 0 0 0 0  Number falls in past yr: 0 0 0 0 0  Injury with Fall? 0 0 0 0 0  Risk for fall due to : No Fall Risks No Fall Risks No Fall Risks No Fall Risks No Fall Risks  Follow up Falls prevention discussed;Falls evaluation completed Falls evaluation completed Falls evaluation completed Falls evaluation completed Falls prevention discussed    FALL RISK PREVENTION PERTAINING TO THE HOME:  Any stairs in or around the home? Yes  If so, are there any without handrails? No  Home free  of loose throw rugs in walkways, pet beds, electrical cords, etc? Yes  Adequate lighting in your home to reduce risk of falls? Yes   ASSISTIVE DEVICES UTILIZED TO PREVENT FALLS:  Life alert? No  Use of a cane, walker or w/c? Yes - uses a rollator Grab bars in the bathroom? Yes  Shower chair or bench in shower? No  Elevated toilet seat or a handicapped toilet? Yes     Cognitive Function:        03/12/2022    9:32 AM 03/02/2019    8:31 AM 06/24/2017    8:36 AM 06/16/2016    9:15 AM 04/01/2016    8:07 AM  6CIT Screen  What Year? 0 points 0 points 0 points 0 points 0 points  What month? 0 points 0 points 0 points 0 points 0 points  What time? 0 points 0 points 0 points 0 points 0 points  Count back from 20 0 points 0 points 0 points 0 points 0 points  Months in reverse 0 points 0 points 0 points 0 points 0 points  Repeat phrase 0 points 0 points 0 points 0 points 0 points  Total Score 0 points 0 points 0 points 0 points 0 points    Immunizations Immunization History  Administered Date(s) Administered   Fluad Quad(high Dose 65+) 10/09/2019, 09/24/2021   Influenza, High Dose Seasonal PF 10/06/2017, 10/27/2018, 10/08/2020   Influenza,inj,quad, With Preservative 09/07/2015   Influenza-Unspecified 10/07/2014, 09/06/2016, 10/27/2018   Moderna SARS-COV2 Booster Vaccination 07/04/2020, 09/17/2020   PFIZER(Purple Top)SARS-COV-2 Vaccination 02/04/2019, 02/28/2019, 10/09/2019   Pneumococcal Conjugate-13 11/21/2013   Pneumococcal Polysaccharide-23 01/07/2006, 03/06/2015   Pneumococcal-Unspecified 03/06/2015   Tdap 09/17/2020   Zoster Recombinat (Shingrix) 04/01/2016, 06/16/2016   Zoster, Live 01/08/2007    TDAP status: Up to date  Flu Vaccine status: Up to date  Pneumococcal vaccine status: Up to date  Covid-19 vaccine status: Completed vaccines  Qualifies for Shingles Vaccine? Yes   Zostavax completed Yes   Shingrix Completed?: Yes  Screening Tests Health Maintenance  Topic  Date Due   COVID-19 Vaccine (6 - 2023-24 season) 09/06/2021   MAMMOGRAM  04/04/2022   Medicare Annual Wellness (AWV)  03/12/2023   DTaP/Tdap/Td (2 - Td or Tdap) 09/18/2030   Pneumonia Vaccine 82+ Years old  Completed   INFLUENZA VACCINE  Completed   DEXA SCAN  Completed   Zoster Vaccines- Shingrix  Completed   HPV VACCINES  Aged Out    Health Maintenance  Health Maintenance Due  Topic Date Due   COVID-19 Vaccine (6 - 2023-24 season) 09/06/2021    Colorectal cancer screening: No longer required.   Mammogram status: No longer required due to age.- has one scheduled 04/07/22  Bone Density status: Completed 03/22/20. Results reflect: Bone density results: NORMAL. Repeat every 5 years.  Lung Cancer Screening: (Low Dose CT Chest recommended if Age 51-80 years, 30 pack-year currently smoking OR have quit w/in 15years.) does not qualify.     Additional Screening:  Hepatitis C Screening: does not qualify; Completed no  Vision Screening: Recommended annual ophthalmology exams for early detection of glaucoma and other disorders of the eye. Is the patient up to date with their annual eye exam?  Yes  Who is the provider or what is the name of the office in which the patient attends annual eye exams? Dr.Scrogs in [place] If pt is not established with a provider, would they like to be referred to a provider to establish care? No .   Dental Screening: Recommended annual dental exams for proper oral hygiene  Community Resource Referral / Chronic Care Management: CRR required this visit?  No   CCM required this visit?  No      Plan:     I have personally reviewed and noted the following in the patient's chart:   Medical and social history Use of alcohol, tobacco or illicit drugs  Current medications and supplements including opioid prescriptions. Patient is not currently taking opioid prescriptions. Functional ability and status Nutritional status Physical activity Advanced  directives List of other physicians Hospitalizations, surgeries, and ER visits in previous 12 months Vitals Screenings to include cognitive, depression, and falls Referrals and appointments  In addition, I have reviewed and discussed with patient certain preventive protocols, quality metrics, and best practice recommendations. A written personalized care plan for preventive services as well as general preventive health recommendations were provided to patient.     Dionisio David, LPN   [id number]   Nurse Notes: none

## 2022-03-12 NOTE — Patient Instructions (Signed)
Maria Gibson , Thank you for taking time to come for your Medicare Wellness Visit. I appreciate your ongoing commitment to your health goals. Please review the following plan we discussed and let me know if I can assist you in the future.   These are the goals we discussed:  Goals      DIET - EAT MORE FRUITS AND VEGETABLES     DIET - INCREASE WATER INTAKE     Recommend to drink at least 6-8 8oz glasses of water per day.     Increase physical activity        This is a list of the screening recommended for you and due dates:  Health Maintenance  Topic Date Due   COVID-19 Vaccine (6 - 2023-24 season) 09/06/2021   Mammogram  04/04/2022   Medicare Annual Wellness Visit  03/12/2023   DTaP/Tdap/Td vaccine (2 - Td or Tdap) 09/18/2030   Pneumonia Vaccine  Completed   Flu Shot  Completed   DEXA scan (bone density measurement)  Completed   Zoster (Shingles) Vaccine  Completed   HPV Vaccine  Aged Out    Advanced directives: yes  Conditions/risks identified: no  Next appointment: Follow up in one year for your annual wellness visit 03/18/23 @ 9:15 am in person   Preventive Care 65 Years and Older, Female Preventive care refers to lifestyle choices and visits with your health care provider that can promote health and wellness. What does preventive care include? A yearly physical exam. This is also called an annual well check. Dental exams once or twice a year. Routine eye exams. Ask your health care provider how often you should have your eyes checked. Personal lifestyle choices, including: Daily care of your teeth and gums. Regular physical activity. Eating a healthy diet. Avoiding tobacco and drug use. Limiting alcohol use. Practicing safe sex. Taking low-dose aspirin every day. Taking vitamin and mineral supplements as recommended by your health care provider. What happens during an annual well check? The services and screenings done by your health care provider during your  annual well check will depend on your age, overall health, lifestyle risk factors, and family history of disease. Counseling  Your health care provider may ask you questions about your: Alcohol use. Tobacco use. Drug use. Emotional well-being. Home and relationship well-being. Sexual activity. Eating habits. History of falls. Memory and ability to understand (cognition). Work and work Statistician. Reproductive health. Screening  You may have the following tests or measurements: Height, weight, and BMI. Blood pressure. Lipid and cholesterol levels. These may be checked every 5 years, or more frequently if you are over 18 years old. Skin check. Lung cancer screening. You may have this screening every year starting at age 38 if you have a 30-pack-year history of smoking and currently smoke or have quit within the past 15 years. Fecal occult blood test (FOBT) of the stool. You may have this test every year starting at age 58. Flexible sigmoidoscopy or colonoscopy. You may have a sigmoidoscopy every 5 years or a colonoscopy every 10 years starting at age 33. Hepatitis C blood test. Hepatitis B blood test. Sexually transmitted disease (STD) testing. Diabetes screening. This is done by checking your blood sugar (glucose) after you have not eaten for a while (fasting). You may have this done every 1-3 years. Bone density scan. This is done to screen for osteoporosis. You may have this done starting at age 89. Mammogram. This may be done every 1-2 years. Talk to your health  care provider about how often you should have regular mammograms. Talk with your health care provider about your test results, treatment options, and if necessary, the need for more tests. Vaccines  Your health care provider may recommend certain vaccines, such as: Influenza vaccine. This is recommended every year. Tetanus, diphtheria, and acellular pertussis (Tdap, Td) vaccine. You may need a Td booster every 10  years. Zoster vaccine. You may need this after age 51. Pneumococcal 13-valent conjugate (PCV13) vaccine. One dose is recommended after age 67. Pneumococcal polysaccharide (PPSV23) vaccine. One dose is recommended after age 56. Talk to your health care provider about which screenings and vaccines you need and how often you need them. This information is not intended to replace advice given to you by your health care provider. Make sure you discuss any questions you have with your health care provider. Document Released: 01/19/2015 Document Revised: 09/12/2015 Document Reviewed: 10/24/2014 Elsevier Interactive Patient Education  2017 [place] Prevention in the Home Falls can cause injuries. They can happen to people of all ages. There are many things you can do to make your home safe and to help prevent falls. What can I do on the outside of my home? Regularly fix the edges of walkways and driveways and fix any cracks. Remove anything that might make you trip as you walk through a door, such as a raised step or threshold. Trim any bushes or trees on the path to your home. Use bright outdoor lighting. Clear any walking paths of anything that might make someone trip, such as rocks or tools. Regularly check to see if handrails are loose or broken. Make sure that both sides of any steps have handrails. Any raised decks and porches should have guardrails on the edges. Have any leaves, snow, or ice cleared regularly. Use sand or salt on walking paths during winter. Clean up any spills in your garage right away. This includes oil or grease spills. What can I do in the bathroom? Use night lights. Install grab bars by the toilet and in the tub and shower. Do not use towel bars as grab bars. Use non-skid mats or decals in the tub or shower. If you need to sit down in the shower, use a plastic, non-slip stool. Keep the floor dry. Clean up any water that spills on the floor as soon as it  happens. Remove soap buildup in the tub or shower regularly. Attach bath mats securely with double-sided non-slip rug tape. Do not have throw rugs and other things on the floor that can make you trip. What can I do in the bedroom? Use night lights. Make sure that you have a light by your bed that is easy to reach. Do not use any sheets or blankets that are too big for your bed. They should not hang down onto the floor. Have a firm chair that has side arms. You can use this for support while you get dressed. Do not have throw rugs and other things on the floor that can make you trip. What can I do in the kitchen? Clean up any spills right away. Avoid walking on wet floors. Keep items that you use a lot in easy-to-reach places. If you need to reach something above you, use a strong step stool that has a grab bar. Keep electrical cords out of the way. Do not use floor polish or wax that makes floors slippery. If you must use wax, use non-skid floor wax. Do not have throw  rugs and other things on the floor that can make you trip. What can I do with my stairs? Do not leave any items on the stairs. Make sure that there are handrails on both sides of the stairs and use them. Fix handrails that are broken or loose. Make sure that handrails are as long as the stairways. Check any carpeting to make sure that it is firmly attached to the stairs. Fix any carpet that is loose or worn. Avoid having throw rugs at the top or bottom of the stairs. If you do have throw rugs, attach them to the floor with carpet tape. Make sure that you have a light switch at the top of the stairs and the bottom of the stairs. If you do not have them, ask someone to add them for you. What else can I do to help prevent falls? Wear shoes that: Do not have high heels. Have rubber bottoms. Are comfortable and fit you well. Are closed at the toe. Do not wear sandals. If you use a stepladder: Make sure that it is fully opened.  Do not climb a closed stepladder. Make sure that both sides of the stepladder are locked into place. Ask someone to hold it for you, if possible. Clearly mark and make sure that you can see: Any grab bars or handrails. First and last steps. Where the edge of each step is. Use tools that help you move around (mobility aids) if they are needed. These include: Canes. Walkers. Scooters. Crutches. Turn on the lights when you go into a dark area. Replace any light bulbs as soon as they burn out. Set up your furniture so you have a clear path. Avoid moving your furniture around. If any of your floors are uneven, fix them. If there are any pets around you, be aware of where they are. Review your medicines with your doctor. Some medicines can make you feel dizzy. This can increase your chance of falling. Ask your doctor what other things that you can do to help prevent falls. This information is not intended to replace advice given to you by your health care provider. Make sure you discuss any questions you have with your health care provider. Document Released: 10/19/2008 Document Revised: 05/31/2015 Document Reviewed: 01/27/2014 Elsevier Interactive Patient Education  2017 [employer].

## 2022-03-18 ENCOUNTER — Encounter: Payer: Medicare PPO | Admitting: Internal Medicine

## 2022-04-07 ENCOUNTER — Ambulatory Visit
Admission: RE | Admit: 2022-04-07 | Discharge: 2022-04-07 | Disposition: A | Discharge status: Home / Self Care | Payer: Medicare PPO | Source: Ambulatory Visit | Attending: Internal Medicine | Admitting: Internal Medicine

## 2022-04-07 DIAGNOSIS — Z1231 Encounter for screening mammogram for malignant neoplasm of breast: Secondary | ICD-10-CM | POA: Insufficient documentation

## 2022-04-08 ENCOUNTER — Encounter: Payer: Self-pay | Admitting: Internal Medicine

## 2022-04-08 ENCOUNTER — Ambulatory Visit (INDEPENDENT_AMBULATORY_CARE_PROVIDER_SITE_OTHER): Payer: Medicare PPO | Admitting: Internal Medicine

## 2022-04-08 VITALS — BP 122/78 | HR 75 | Ht 60.0 in | Wt 168.0 lb

## 2022-04-08 DIAGNOSIS — Z1231 Encounter for screening mammogram for malignant neoplasm of breast: Secondary | ICD-10-CM | POA: Diagnosis not present

## 2022-04-08 DIAGNOSIS — I10 Essential (primary) hypertension: Secondary | ICD-10-CM | POA: Diagnosis not present

## 2022-04-08 DIAGNOSIS — Z1211 Encounter for screening for malignant neoplasm of colon: Secondary | ICD-10-CM

## 2022-04-08 DIAGNOSIS — E781 Pure hyperglyceridemia: Secondary | ICD-10-CM

## 2022-04-08 DIAGNOSIS — Z0000 Encounter for general adult medical examination without abnormal findings: Secondary | ICD-10-CM | POA: Diagnosis not present

## 2022-04-08 NOTE — Progress Notes (Signed)
Date:  04/08/2022   Name:  Maria Gibson   DOB:  25-May-1940   MRN:  [id number]   Chief Complaint: Annual Exam Maria Gibson is a 82 y.o. female who presents today for her Complete Annual Exam. She feels well. She reports exercising. She reports she is sleeping poorly. Breast complaints - none. Recently filed for bankruptcy due to house repairs needed.  It has been an ongoing process causing some anxiety.  Mammogram: 04/07/22 DEXA: 03/2020 Normal Colonoscopy: none  Health Maintenance Due  Topic Date Due   COVID-19 Vaccine (6 - 2023-24 season) 09/06/2021   MAMMOGRAM  04/04/2022    Immunization History  Administered Date(s) Administered   Fluad Quad(high Dose 65+) 10/09/2019, 09/24/2021   Influenza, High Dose Seasonal PF 10/06/2017, 10/27/2018, 10/08/2020   Influenza,inj,quad, With Preservative 09/07/2015   Influenza-Unspecified 10/07/2014, 09/06/2016, 10/27/2018   Moderna SARS-COV2 Booster Vaccination 07/04/2020, 09/17/2020   PFIZER(Purple Top)SARS-COV-2 Vaccination 02/04/2019, 02/28/2019, 10/09/2019   Pneumococcal Conjugate-13 11/21/2013   Pneumococcal Polysaccharide-23 01/07/2006, 03/06/2015   Pneumococcal-Unspecified 03/06/2015   Tdap 09/17/2020   Zoster Recombinat (Shingrix) 04/01/2016, 06/16/2016   Zoster, Live 01/08/2007    Hypertension This is a chronic problem. The problem is controlled. Pertinent negatives include no chest pain, headaches, palpitations or shortness of breath. Past treatments include ACE inhibitors. The current treatment provides significant improvement. There is no history of kidney disease, CAD/MI or CVA.    Lab Results  Component Value Date   NA 142 03/15/2021   K 4.9 03/15/2021   CO2 26 03/15/2021   GLUCOSE 83 03/15/2021   BUN 22 03/15/2021   CREATININE 0.79 03/15/2021   CALCIUM 10.1 03/15/2021   EGFR 75 03/15/2021   GFRNONAA 49 (L) 03/09/2019   Lab Results  Component Value Date   CHOL 188 03/15/2021   HDL 70 03/15/2021    LDLCALC 96 03/15/2021   TRIG 129 03/15/2021   CHOLHDL 2.7 03/15/2021   Lab Results  Component Value Date   TSH 2.350 03/15/2021   Lab Results  Component Value Date   HGBA1C 5.4 03/15/2021   Lab Results  Component Value Date   WBC 6.0 03/15/2021   HGB 15.9 03/15/2021   HCT 46.3 03/15/2021   MCV 94 03/15/2021   PLT 208 03/15/2021   Lab Results  Component Value Date   ALT 18 03/15/2021   AST 20 03/15/2021   ALKPHOS 72 03/15/2021   BILITOT 0.4 03/15/2021   No results found for: "25OHVITD2", "25OHVITD3", "VD25OH"   Review of Systems  Constitutional:  Negative for chills, fatigue and fever.  HENT:  Negative for congestion, hearing loss, tinnitus, trouble swallowing and voice change.   Eyes:  Negative for visual disturbance.  Respiratory:  Negative for cough, chest tightness, shortness of breath and wheezing.   Cardiovascular:  Negative for chest pain, palpitations and leg swelling.  Gastrointestinal:  Negative for abdominal pain, constipation, diarrhea and vomiting.  Endocrine: Negative for polydipsia and polyuria.  Genitourinary:  Positive for frequency (managed with Depends) and urgency. Negative for dysuria, genital sores, vaginal bleeding and vaginal discharge.  Musculoskeletal:  Negative for arthralgias, gait problem and joint swelling.  Skin:  Negative for color change and rash.  Neurological:  Negative for dizziness, tremors, light-headedness and headaches.  Hematological:  Negative for adenopathy. Does not bruise/bleed easily.  Psychiatric/Behavioral:  Negative for dysphoric mood and sleep disturbance. The patient is not nervous/anxious.     Patient Active Problem List   Diagnosis Date Noted   Artificial knee joint present  12/23/2018   Osteoarthritis of right shoulder 03/06/2017   Arthritis of right knee 04/15/2016   Blepharoptosis, acquired, left 04/15/2016   OA (osteoarthritis) of ankle 10/17/2015   Essential (primary) hypertension 12/01/2014   GERD without  esophagitis 12/01/2014   Hypertriglyceridemia 12/01/2014   Degenerative arthritis of finger 12/01/2014   Urge incontinence of urine 12/01/2014   Sequelae of cerebral infarction 12/01/2014    Allergies  Allergen Reactions   Baclofen Swelling    hands   Codeine    Iodine    Morphine Sulfate    Shellfish Allergy    Sulfa Antibiotics Nausea And Vomiting    Past Surgical History:  Procedure Laterality Date   INTRACAPSULAR CATARACT EXTRACTION Bilateral 2017   KNEE SURGERY     REVERSE TOTAL SHOULDER ARTHROPLASTY Right 07/2018   TOTAL SHOULDER REPLACEMENT Left    TUBAL LIGATION     VARICOSE VEIN SURGERY      Social History   Tobacco Use   Smoking status: Never   Smokeless tobacco: Never   Tobacco comments:    smoking cessation materials not required  Vaping Use   Vaping Use: Never used  Substance Use Topics   Alcohol use: Yes    Alcohol/week: 1.0 standard drink of alcohol    Types: 1 Glasses of wine per week    Comment: occasional   Drug use: No     Medication list has been reviewed and updated.  Current Meds  Medication Sig   aspirin 325 MG EC tablet Take 325 mg by mouth daily.   Calcium Carb-Cholecalciferol (CALCIUM + D3) 600-200 MG-UNIT TABS Take by mouth.   COLLAGEN PO Take by mouth. Collagen protein powder mixed with coffee daily in AM   lisinopril (ZESTRIL) 10 MG tablet TAKE 1 TABLET(10 MG) BY MOUTH DAILY   meloxicam (MOBIC) 15 MG tablet meloxicam 15 mg tablet   Multiple Vitamins-Minerals (MULTIVITAMIN ADULTS 50+ PO) Take by mouth.   OVER THE COUNTER MEDICATION Lectin shield gundry supplement BID   OVER THE COUNTER MEDICATION Bio complete 3 probiotic gundry supply   Zinc 20 MG CAPS TAKE 4 CAPSULES BY MOUTH 1 HOUR BEFORE APPOINTMENT       04/08/2022   10:29 AM 09/24/2021    9:47 AM 07/02/2021   11:12 AM 03/15/2021    9:53 AM  GAD 7 : Generalized Anxiety Score  Nervous, Anxious, on Edge 1 1 0 0  Control/stop worrying 1 0 0 0  Worry too much - different  things 1 0 0 0  Trouble relaxing 0 1 0 0  Restless 0 0 0 0  Easily annoyed or irritable 0 0 0 0  Afraid - awful might happen 0 1 0 0  Total GAD 7 Score 3 3 0 0  Anxiety Difficulty Not difficult at all Not difficult at all Not difficult at all Not difficult at all       04/08/2022   10:29 AM 03/12/2022    9:23 AM 09/24/2021    9:47 AM  Depression screen PHQ 2/9  Decreased Interest 0 0 0  Down, Depressed, Hopeless 0 0 0  PHQ - 2 Score 0 0 0  Altered sleeping 1 0 1  Tired, decreased energy 1 0 1  Change in appetite 0 0 0  Feeling bad or failure about yourself  0 0 0  Trouble concentrating 0 0 0  Moving slowly or fidgety/restless 1 0 0  Suicidal thoughts 0 0 0  PHQ-9 Score 3 0 2  Difficult doing  work/chores Somewhat difficult Not difficult at all Not difficult at all    BP Readings from Last 3 Encounters:  04/08/22 122/78  03/12/22 120/80  09/24/21 124/84    Physical Exam Vitals and nursing note reviewed.  Constitutional:      General: She is not in acute distress.    Appearance: She is well-developed.  HENT:     Head: Normocephalic and atraumatic.     Right Ear: Tympanic membrane and ear canal normal.     Left Ear: Tympanic membrane and ear canal normal.     Nose:     Right Sinus: No maxillary sinus tenderness.     Left Sinus: No maxillary sinus tenderness.  Eyes:     General: No scleral icterus.       Right eye: No discharge.        Left eye: No discharge.     Conjunctiva/sclera: Conjunctivae normal.  Neck:     Thyroid: No thyromegaly.     Vascular: No carotid bruit.  Cardiovascular:     Rate and Rhythm: Normal rate and regular rhythm.     Pulses: Normal pulses.     Heart sounds: Normal heart sounds.  Pulmonary:     Effort: Pulmonary effort is normal. No respiratory distress.     Breath sounds: No wheezing.  Chest:  Breasts:    Right: No mass, nipple discharge, skin change or tenderness.     Left: No mass, nipple discharge, skin change or tenderness.        Comments: Smooth soft lipoma Abdominal:     General: Bowel sounds are normal.     Palpations: Abdomen is soft.     Tenderness: There is no abdominal tenderness.  Musculoskeletal:     Cervical back: Normal range of motion. No erythema.     Right lower leg: No edema.     Left lower leg: No edema.  Lymphadenopathy:     Cervical: No cervical adenopathy.  Skin:    General: Skin is warm and dry.     Capillary Refill: Capillary refill takes less than 2 seconds.     Findings: No rash.  Neurological:     General: No focal deficit present.     Mental Status: She is alert and oriented to person, place, and time.     Cranial Nerves: No cranial nerve deficit.     Sensory: No sensory deficit.     Deep Tendon Reflexes: Reflexes are normal and symmetric.  Psychiatric:        Attention and Perception: Attention normal.        Mood and Affect: Mood normal.     Wt Readings from Last 3 Encounters:  04/08/22 168 lb (76.2 kg)  03/12/22 166 lb 9.6 oz (75.6 kg)  09/24/21 166 lb 6.4 oz (75.5 kg)    BP 122/78   Pulse 75   Ht 5' (1.524 m)   Wt 168 lb (76.2 kg)   SpO2 97%   BMI 32.81 kg/m   Assessment and Plan:  Problem List Items Addressed This Visit       Cardiovascular and Mediastinum   Essential (primary) hypertension    Clinically stable exam with well controlled BP on lisinopril. Tolerating medications without side effects. Pt to continue current regimen and low sodium diet.       Relevant Orders   CBC with Differential/Platelet   Comprehensive metabolic panel   TSH   Urinalysis, Routine w reflex microscopic     Other  Hypertriglyceridemia    Controlled with diet alone      Relevant Orders   Lipid panel   Other Visit Diagnoses     Annual physical exam    -  Primary   Doing well for age 28 for reduction in anxiety once bankruptcy is finalized   Encounter for screening mammogram for breast cancer       mammo done yesterday - report pending   Colon cancer  screening       Relevant Orders   Fecal occult blood, imunochemical       Return in about 6 months (around 10/08/2022) for HTN.   Partially dictated using [place], any errors are not intentional.  Glean Hess, MD [place], [place]

## 2022-04-08 NOTE — Assessment & Plan Note (Signed)
Clinically stable exam with well controlled BP on lisinopril. Tolerating medications without side effects. Pt to continue current regimen and low sodium diet.  

## 2022-04-08 NOTE — Assessment & Plan Note (Signed)
Controlled with diet alone.

## 2022-04-09 LAB — LIPID PANEL
Chol/HDL Ratio: 1.9 ratio (ref 0.0–4.4)
Cholesterol, Total: 182 mg/dL (ref 100–199)
HDL: 94 mg/dL (ref >39)
LDL Chol Calc (NIH): 78 mg/dL (ref 0–99)
Triglycerides: 52 mg/dL (ref 0–149)
VLDL Cholesterol Cal: 10 mg/dL (ref 5–40)

## 2022-04-09 LAB — URINALYSIS, ROUTINE W REFLEX MICROSCOPIC
Bilirubin, UA: NEGATIVE
Glucose, UA: NEGATIVE
Ketones, UA: NEGATIVE
Nitrite, UA: NEGATIVE
Protein,UA: NEGATIVE
RBC, UA: NEGATIVE
Specific Gravity, UA: 1.016 (ref 1.005–1.030)
Urobilinogen, Ur: 0.2 mg/dL (ref 0.2–1.0)
pH, UA: 7 (ref 5.0–7.5)

## 2022-04-09 LAB — COMPREHENSIVE METABOLIC PANEL
ALT: 18 IU/L (ref 0–32)
AST: 17 IU/L (ref 0–40)
Albumin/Globulin Ratio: 1.9 (ref 1.2–2.2)
Albumin: 4.1 g/dL (ref 3.7–4.7)
Alkaline Phosphatase: 71 IU/L (ref 44–121)
BUN/Creatinine Ratio: 31 — ABNORMAL HIGH (ref 12–28)
BUN: 27 mg/dL (ref 8–27)
Bilirubin Total: 0.4 mg/dL (ref 0.0–1.2)
CO2: 27 mmol/L (ref 20–29)
Calcium: 9.2 mg/dL (ref 8.7–10.3)
Chloride: 101 mmol/L (ref 96–106)
Creatinine, Ser: 0.87 mg/dL (ref 0.57–1.00)
Globulin, Total: 2.2 g/dL (ref 1.5–4.5)
Glucose: 93 mg/dL (ref 70–99)
Potassium: 5.3 mmol/L — ABNORMAL HIGH (ref 3.5–5.2)
Sodium: 140 mmol/L (ref 134–144)
Total Protein: 6.3 g/dL (ref 6.0–8.5)
eGFR: 66 mL/min/{1.73_m2} (ref >59)

## 2022-04-09 LAB — CBC WITH DIFFERENTIAL/PLATELET
Basophils Absolute: 0 10*3/uL (ref 0.0–0.2)
Basos: 0 %
EOS (ABSOLUTE): 0.1 10*3/uL (ref 0.0–0.4)
Eos: 2 %
Hematocrit: 44.2 % (ref 34.0–46.6)
Hemoglobin: 14.6 g/dL (ref 11.1–15.9)
Immature Grans (Abs): 0 10*3/uL (ref 0.0–0.1)
Immature Granulocytes: 0 %
Lymphocytes Absolute: 2 10*3/uL (ref 0.7–3.1)
Lymphs: 28 %
MCH: 31.3 pg (ref 26.6–33.0)
MCHC: 33 g/dL (ref 31.5–35.7)
MCV: 95 fL (ref 79–97)
Monocytes Absolute: 0.5 10*3/uL (ref 0.1–0.9)
Monocytes: 7 %
Neutrophils Absolute: 4.5 10*3/uL (ref 1.4–7.0)
Neutrophils: 63 %
Platelets: 206 10*3/uL (ref 150–450)
RBC: 4.67 x10E6/uL (ref 3.77–5.28)
RDW: 11.7 % (ref 11.7–15.4)
WBC: 7.1 10*3/uL (ref 3.4–10.8)

## 2022-04-09 LAB — MICROSCOPIC EXAMINATION
Bacteria, UA: NONE SEEN
Casts: NONE SEEN /lpf
RBC, Urine: NONE SEEN /hpf (ref 0–2)
WBC, UA: NONE SEEN /hpf (ref 0–5)

## 2022-04-09 LAB — TSH: TSH: 1.75 u[IU]/mL (ref 0.450–4.500)

## 2022-04-12 LAB — FECAL OCCULT BLOOD, IMMUNOCHEMICAL: Fecal Occult Bld: NEGATIVE

## 2022-04-23 ENCOUNTER — Encounter: Payer: Self-pay | Admitting: Internal Medicine

## 2022-04-23 ENCOUNTER — Ambulatory Visit: Payer: Medicare PPO | Admitting: Internal Medicine

## 2022-04-23 VITALS — BP 126/78 | HR 68 | Ht 60.0 in | Wt 168.6 lb

## 2022-04-23 DIAGNOSIS — R591 Generalized enlarged lymph nodes: Secondary | ICD-10-CM | POA: Diagnosis not present

## 2022-04-23 NOTE — Progress Notes (Signed)
Date:  04/23/2022   Name:  Maria Gibson   DOB:  1940-04-13   MRN:  [id number]   Chief Complaint: Adenopathy (Left neck lymph node swollen- not painful. Patient seen the dentist last week because she had tooth pain two weeks ago. )  HPI Lymph node - she noticed some pain in her lower left jaw about 2 weeks ago.  Dental evaluation did not reveal any tooth issues or infection.  Shortly after that patient noticed enlarged lymph node behind her left jaw.  She denies any sinus or URI symptoms.  No fever, cough or skin infection. She has noticed some itching across her shoulders and back but no blisters or raised lesions.   Lab Results  Component Value Date   NA 140 04/08/2022   K 5.3 (H) 04/08/2022   CO2 27 04/08/2022   GLUCOSE 93 04/08/2022   BUN 27 04/08/2022   CREATININE 0.87 04/08/2022   CALCIUM 9.2 04/08/2022   EGFR 66 04/08/2022   GFRNONAA 49 (L) 03/09/2019   Lab Results  Component Value Date   CHOL 182 04/08/2022   HDL 94 04/08/2022   LDLCALC 78 04/08/2022   TRIG 52 04/08/2022   CHOLHDL 1.9 04/08/2022   Lab Results  Component Value Date   TSH 1.750 04/08/2022   Lab Results  Component Value Date   HGBA1C 5.4 03/15/2021   Lab Results  Component Value Date   WBC 7.1 04/08/2022   HGB 14.6 04/08/2022   HCT 44.2 04/08/2022   MCV 95 04/08/2022   PLT 206 04/08/2022   Lab Results  Component Value Date   ALT 18 04/08/2022   AST 17 04/08/2022   ALKPHOS 71 04/08/2022   BILITOT 0.4 04/08/2022   No results found for: "25OHVITD2", "25OHVITD3", "VD25OH"   Review of Systems  Constitutional:  Negative for chills, fatigue and fever.  HENT:  Positive for dental problem. Negative for ear pain, sinus pressure, sore throat and trouble swallowing.   Respiratory:  Negative for chest tightness and shortness of breath.   Cardiovascular:  Negative for chest pain.  Skin:  Positive for rash (itching back and shoulders).  Hematological:  Positive for adenopathy (left  posterior auricular).    Patient Active Problem List   Diagnosis Date Noted   Artificial knee joint present 12/23/2018   Osteoarthritis of right shoulder 03/06/2017   Arthritis of right knee 04/15/2016   Blepharoptosis, acquired, left 04/15/2016   OA (osteoarthritis) of ankle 10/17/2015   Essential (primary) hypertension 12/01/2014   GERD without esophagitis 12/01/2014   Hypertriglyceridemia 12/01/2014   Degenerative arthritis of finger 12/01/2014   Urge incontinence of urine 12/01/2014   Sequelae of cerebral infarction 12/01/2014    Allergies  Allergen Reactions   Baclofen Swelling    hands   Codeine    Iodine    Morphine Sulfate    Shellfish Allergy    Sulfa Antibiotics Nausea And Vomiting    Past Surgical History:  Procedure Laterality Date   INTRACAPSULAR CATARACT EXTRACTION Bilateral 2017   KNEE SURGERY     REVERSE TOTAL SHOULDER ARTHROPLASTY Right 07/2018   TOTAL SHOULDER REPLACEMENT Left    TUBAL LIGATION     VARICOSE VEIN SURGERY      Social History   Tobacco Use   Smoking status: Never   Smokeless tobacco: Never   Tobacco comments:    smoking cessation materials not required  Vaping Use   Vaping Use: Never used  Substance Use Topics   Alcohol use:  Yes    Alcohol/week: 1.0 standard drink of alcohol    Types: 1 Glasses of wine per week    Comment: occasional   Drug use: No     Medication list has been reviewed and updated.  Current Meds  Medication Sig   aspirin 325 MG EC tablet Take 325 mg by mouth daily.   Calcium Carb-Cholecalciferol (CALCIUM + D3) 600-200 MG-UNIT TABS Take by mouth.   COLLAGEN PO Take by mouth. Collagen protein powder mixed with coffee daily in AM   lisinopril (ZESTRIL) 10 MG tablet TAKE 1 TABLET(10 MG) BY MOUTH DAILY   meloxicam (MOBIC) 15 MG tablet meloxicam 15 mg tablet   Multiple Vitamins-Minerals (MULTIVITAMIN ADULTS 50+ PO) Take by mouth.   OVER THE COUNTER MEDICATION Lectin shield gundry supplement BID   OVER THE  COUNTER MEDICATION Bio complete 3 probiotic gundry supply   Zinc 20 MG CAPS TAKE 4 CAPSULES BY MOUTH 1 HOUR BEFORE APPOINTMENT       04/23/2022   10:19 AM 04/08/2022   10:29 AM 09/24/2021    9:47 AM 07/02/2021   11:12 AM  GAD 7 : Generalized Anxiety Score  Nervous, Anxious, on Edge 0  Control/stop worrying 0 1 0 0  Worry too much - different things 0 1 0 0  Trouble relaxing 0 0 1 0  Restless 0 0 0 0  Easily annoyed or irritable 0 0 0 0  Afraid - awful might happen 0 0 1 0  Total GAD 7 Score 0  Anxiety Difficulty Not difficult at all Not difficult at all Not difficult at all Not difficult at all       04/23/2022   10:18 AM 04/08/2022   10:29 AM 03/12/2022    9:23 AM  Depression screen PHQ 2/9  Decreased Interest 0 0 0  Down, Depressed, Hopeless 0 0 0  PHQ - 2 Score 0 0 0  Altered sleeping 0 1 0  Tired, decreased energy 1 1 0  Change in appetite 0 0 0  Feeling bad or failure about yourself  0 0 0  Trouble concentrating 0 0 0  Moving slowly or fidgety/restless 0 1 0  Suicidal thoughts 0 0 0  PHQ-9 Score 1 3 0  Difficult doing work/chores Not difficult at all Somewhat difficult Not difficult at all    BP Readings from Last 3 Encounters:  04/23/22 126/78  04/08/22 122/78  03/12/22 120/80    Physical Exam Constitutional:      Appearance: She is well-developed.  HENT:     Right Ear: Ear canal and external ear normal. Tympanic membrane is not erythematous or retracted.     Left Ear: Ear canal and external ear normal. Tympanic membrane is not erythematous or retracted.     Nose:     Right Sinus: Maxillary sinus tenderness and frontal sinus tenderness present.     Left Sinus: Maxillary sinus tenderness and frontal sinus tenderness present.     Mouth/Throat:     Mouth: No oral lesions.     Pharynx: Uvula midline. No oropharyngeal exudate or posterior oropharyngeal erythema.  Neck:     Comments: 1 cm smooth soft mobile mass at the angle of the jaw on  left Cardiovascular:     Rate and Rhythm: Normal rate and regular rhythm.     Heart sounds: Normal heart sounds.  Pulmonary:     Breath sounds: Normal breath sounds. No wheezing or rales.  Skin:  Comments: Tiny red excoriated lesions across back and shoulders - no s/s of bacterial infection  Neurological:     Mental Status: She is alert and oriented to person, place, and time.     Wt Readings from Last 3 Encounters:  04/23/22 168 lb 9.6 oz (76.5 kg)  04/08/22 168 lb (76.2 kg)  03/12/22 166 lb 9.6 oz (75.6 kg)    BP 126/78   Pulse 68   Ht 5' (1.524 m)   Wt 168 lb 9.6 oz (76.5 kg)   SpO2 98%   BMI 32.93 kg/m   Assessment and Plan:  Problem List Items Addressed This Visit   None Visit Diagnoses     Lymphadenopathy    -  Primary   Of uncertain cause - no evidence of URI or dental source will refer to ENT for evaluation   Relevant Orders   Ambulatory referral to ENT       No follow-ups on file.   Partially dictated using Dragon software, any errors are not intentional.  Reubin Milan, MD [facility] [place], [place]

## 2022-05-13 DIAGNOSIS — K1120 Sialoadenitis, unspecified: Secondary | ICD-10-CM | POA: Diagnosis not present

## 2022-05-21 ENCOUNTER — Other Ambulatory Visit: Payer: Self-pay | Admitting: Otolaryngology

## 2022-05-21 DIAGNOSIS — K1120 Sialoadenitis, unspecified: Secondary | ICD-10-CM

## 2022-05-29 ENCOUNTER — Other Ambulatory Visit: Payer: Self-pay | Admitting: Internal Medicine

## 2022-05-29 DIAGNOSIS — I10 Essential (primary) hypertension: Secondary | ICD-10-CM

## 2022-05-29 NOTE — Telephone Encounter (Signed)
Requested by interface surescripts. Last OV 1 month ago last labs 04/08/22.  Requested Prescriptions  Pending Prescriptions Disp Refills   lisinopril (ZESTRIL) 10 MG tablet [Pharmacy Med Name: LISINOPRIL 10MG  TABLETS] 90 tablet 1    Sig: TAKE 1 TABLET(10 MG) BY MOUTH DAILY     Cardiovascular:  ACE Inhibitors Failed - 05/29/2022  7:26 AM      Failed - K in normal range and within 180 days    Potassium  Date Value Ref Range Status  04/08/2022 5.3 (H) 3.5 - 5.2 mmol/L Final  11/20/2011 3.9 3.5 - 5.1 mmol/L Final         Passed - Cr in normal range and within 180 days    Creatinine  Date Value Ref Range Status  11/20/2011 1.01 0.60 - 1.30 mg/dL Final   Creatinine, Ser  Date Value Ref Range Status  04/08/2022 0.87 0.57 - 1.00 mg/dL Final         Passed - Patient is not pregnant      Passed - Last BP in normal range    BP Readings from Last 1 Encounters:  04/23/22 126/78         Passed - Valid encounter within last 6 months    Recent Outpatient Visits           1 month ago Lymphadenopathy   [place] [facility], Nyoka Cowden, MD   1 month ago Annual physical exam   [facility] at [facility], Nyoka Cowden, MD   8 months ago Muscle spasms of neck   [place] [facility] at [facility], Nyoka Cowden, MD   11 months ago Dysuria   [facility] at [facility], Nyoka Cowden, MD   1 year ago Annual physical exam   [facility] at [facility], Nyoka Cowden, MD       Future Appointments             In 4 months Judithann Graves, Nyoka Cowden, MD [facility] at [facility], [facility]

## 2022-06-03 ENCOUNTER — Ambulatory Visit
Admission: RE | Admit: 2022-06-03 | Discharge: 2022-06-03 | Disposition: A | Discharge status: Home / Self Care | Payer: Medicare PPO | Source: Ambulatory Visit | Attending: Otolaryngology | Admitting: Otolaryngology

## 2022-06-03 DIAGNOSIS — K1120 Sialoadenitis, unspecified: Secondary | ICD-10-CM

## 2022-06-03 DIAGNOSIS — K1121 Acute sialoadenitis: Secondary | ICD-10-CM | POA: Diagnosis not present

## 2022-06-03 DIAGNOSIS — K118 Other diseases of salivary glands: Secondary | ICD-10-CM | POA: Diagnosis not present

## 2022-06-03 MED ORDER — IOPAMIDOL (ISOVUE-300) INJECTION 61%
75.0000 mL | Freq: Once | INTRAVENOUS | Status: AC | PRN
  Administered 2022-06-03: 75 mL via INTRAVENOUS

## 2022-06-26 DIAGNOSIS — D37030 Neoplasm of uncertain behavior of the parotid salivary glands: Secondary | ICD-10-CM | POA: Diagnosis not present

## 2022-07-17 DIAGNOSIS — M5134 Other intervertebral disc degeneration, thoracic region: Secondary | ICD-10-CM | POA: Diagnosis not present

## 2022-07-17 DIAGNOSIS — K118 Other diseases of salivary glands: Secondary | ICD-10-CM | POA: Diagnosis not present

## 2022-07-30 DIAGNOSIS — R221 Localized swelling, mass and lump, neck: Secondary | ICD-10-CM | POA: Diagnosis not present

## 2022-07-30 DIAGNOSIS — K118 Other diseases of salivary glands: Secondary | ICD-10-CM | POA: Diagnosis not present

## 2022-08-19 DIAGNOSIS — R930 Abnormal findings on diagnostic imaging of skull and head, not elsewhere classified: Secondary | ICD-10-CM | POA: Diagnosis not present

## 2022-08-19 DIAGNOSIS — E042 Nontoxic multinodular goiter: Secondary | ICD-10-CM | POA: Diagnosis not present

## 2022-08-19 DIAGNOSIS — K118 Other diseases of salivary glands: Secondary | ICD-10-CM | POA: Diagnosis not present

## 2022-09-18 ENCOUNTER — Encounter: Payer: Self-pay | Admitting: Internal Medicine

## 2022-09-18 ENCOUNTER — Ambulatory Visit: Payer: Medicare PPO | Admitting: Internal Medicine

## 2022-09-18 ENCOUNTER — Telehealth: Payer: Self-pay

## 2022-09-18 VITALS — BP 134/72 | HR 79 | Temp 98.0°F | Ht 60.0 in | Wt 173.0 lb

## 2022-09-18 DIAGNOSIS — K118 Other diseases of salivary glands: Secondary | ICD-10-CM | POA: Diagnosis not present

## 2022-09-18 DIAGNOSIS — R051 Acute cough: Secondary | ICD-10-CM

## 2022-09-18 DIAGNOSIS — J029 Acute pharyngitis, unspecified: Secondary | ICD-10-CM | POA: Diagnosis not present

## 2022-09-18 MED ORDER — AMOXICILLIN 875 MG PO TABS: 875.0000 mg | ORAL_TABLET | Freq: Two times a day (BID) | ORAL | 0 refills | Status: AC

## 2022-09-18 NOTE — Progress Notes (Signed)
Date:  09/18/2022   Name:  Maria Gibson   DOB:  [birth year]   MRN:  [id number]   Chief Complaint: Sore Throat (Sore throat, cough, congestion. X 1 week. Taking robitussin with honey, and that helps some. Post nasal drainage.)  Sore Throat  This is a new problem. The current episode started in the past 7 days. The problem has been unchanged. Neither side of throat is experiencing more pain than the other. There has been no fever. Associated symptoms include coughing and a hoarse voice. Pertinent negatives include no headaches or shortness of breath.    Lab Results  Component Value Date   NA 140 04/08/2022   K 5.3 (H) 04/08/2022   CO2 27 04/08/2022   GLUCOSE 93 04/08/2022   BUN 27 04/08/2022   CREATININE 0.87 04/08/2022   CALCIUM 9.2 04/08/2022   EGFR 66 04/08/2022   GFRNONAA 49 (L) 03/09/2019   Lab Results  Component Value Date   CHOL 182 04/08/2022   HDL 94 04/08/2022   LDLCALC 78 04/08/2022   TRIG 52 04/08/2022   CHOLHDL 1.9 04/08/2022   Lab Results  Component Value Date   TSH 1.750 04/08/2022   Lab Results  Component Value Date   HGBA1C 5.4 03/15/2021   Lab Results  Component Value Date   WBC 7.1 04/08/2022   HGB 14.6 04/08/2022   HCT 44.2 04/08/2022   MCV 95 04/08/2022   PLT 206 04/08/2022   Lab Results  Component Value Date   ALT 18 04/08/2022   AST 17 04/08/2022   ALKPHOS 71 04/08/2022   BILITOT 0.4 04/08/2022   No results found for: "25OHVITD2", "25OHVITD3", "VD25OH"   Review of Systems  Constitutional:  Positive for fatigue. Negative for chills, fever and unexpected weight change.  HENT:  Positive for hoarse voice.   Respiratory:  Positive for cough. Negative for chest tightness and shortness of breath.   Cardiovascular:  Negative for chest pain and palpitations.  Neurological:  Negative for dizziness and headaches.  Psychiatric/Behavioral:  Negative for dysphoric mood and sleep disturbance. The patient is not nervous/anxious.      Patient Active Problem List   Diagnosis Date Noted   Artificial knee joint present 12/23/2018   Osteoarthritis of right shoulder 03/06/2017   Arthritis of right knee 04/15/2016   Blepharoptosis, acquired, left 04/15/2016   OA (osteoarthritis) of ankle 10/17/2015   Essential (primary) hypertension 12/01/2014   GERD without esophagitis 12/01/2014   Hypertriglyceridemia 12/01/2014   Degenerative arthritis of finger 12/01/2014   Urge incontinence of urine 12/01/2014   Sequelae of cerebral infarction 12/01/2014    Allergies  Allergen Reactions   Baclofen Swelling    hands   Codeine    Iodine    Morphine Sulfate    Shellfish Allergy    Sulfa Antibiotics Nausea And Vomiting    Past Surgical History:  Procedure Laterality Date   INTRACAPSULAR CATARACT EXTRACTION Bilateral 2017   KNEE SURGERY     REVERSE TOTAL SHOULDER ARTHROPLASTY Right 07/2018   TOTAL SHOULDER REPLACEMENT Left    TUBAL LIGATION     VARICOSE VEIN SURGERY      Social History   Tobacco Use   Smoking status: Never   Smokeless tobacco: Never   Tobacco comments:    smoking cessation materials not required  Vaping Use   Vaping status: Never Used  Substance Use Topics   Alcohol use: Yes    Alcohol/week: 1.0 standard drink of alcohol    Types: 1  Glasses of wine per week    Comment: occasional   Drug use: No     Medication list has been reviewed and updated.  Current Meds  Medication Sig   amoxicillin (AMOXIL) 875 MG tablet Take 1 tablet (875 mg total) by mouth 2 (two) times daily for 10 days.   aspirin 325 MG EC tablet Take 325 mg by mouth daily.   Calcium Carb-Cholecalciferol (CALCIUM + D3) 600-200 MG-UNIT TABS Take by mouth.   COLLAGEN PO Take by mouth. Collagen protein powder mixed with coffee daily in AM   lisinopril (ZESTRIL) 10 MG tablet TAKE 1 TABLET(10 MG) BY MOUTH DAILY   meloxicam (MOBIC) 15 MG tablet meloxicam 15 mg tablet   Multiple Vitamins-Minerals (MULTIVITAMIN ADULTS 50+ PO) Take  by mouth.   OVER THE COUNTER MEDICATION Lectin shield gundry supplement BID   OVER THE COUNTER MEDICATION Bio complete 3 probiotic gundry supply   Zinc 20 MG CAPS TAKE 4 CAPSULES BY MOUTH 1 HOUR BEFORE APPOINTMENT       04/23/2022   10:19 AM 04/08/2022   10:29 AM 09/24/2021    9:47 AM 07/02/2021   11:12 AM  GAD 7 : Generalized Anxiety Score  Nervous, Anxious, on Edge 1 1 1  0  Control/stop worrying 0 1 0 0  Worry too much - different things 0 1 0 0  Trouble relaxing 0 0 1 0  Restless 0 0 0 0  Easily annoyed or irritable 0 0 0 0  Afraid - awful might happen 0 0 1 0  Total GAD 7 Score 1 3 3  0  Anxiety Difficulty Not difficult at all Not difficult at all Not difficult at all Not difficult at all       04/23/2022   10:18 AM 04/08/2022   10:29 AM 03/12/2022    9:23 AM  Depression screen PHQ 2/9  Decreased Interest 0 0 0  Down, Depressed, Hopeless 0 0 0  PHQ - 2 Score 0 0 0  Altered sleeping 0 1 0  Tired, decreased energy 1 1 0  Change in appetite 0 0 0  Feeling bad or failure about yourself  0 0 0  Trouble concentrating 0 0 0  Moving slowly or fidgety/restless 0 1 0  Suicidal thoughts 0 0 0  PHQ-9 Score 1 3 0  Difficult doing work/chores Not difficult at all Somewhat difficult Not difficult at all    BP Readings from Last 3 Encounters:  09/18/22 134/72  04/23/22 126/78  04/08/22 122/78    Physical Exam Vitals and nursing note reviewed.  Constitutional:      General: She is not in acute distress.    Appearance: She is well-developed.  HENT:     Head: Normocephalic and atraumatic.     Right Ear: Tympanic membrane and ear canal normal.     Left Ear: Tympanic membrane and ear canal normal.     Nose:     Right Sinus: No maxillary sinus tenderness or frontal sinus tenderness.     Left Sinus: No maxillary sinus tenderness or frontal sinus tenderness.     Mouth/Throat:     Pharynx: Posterior oropharyngeal erythema present.     Tonsils: No tonsillar exudate or tonsillar  abscesses.  Neck:     Comments: Mass of the left parotid gland Cardiovascular:     Rate and Rhythm: Normal rate and regular rhythm.     Heart sounds: Normal heart sounds.  Pulmonary:     Effort: Pulmonary effort is normal. No  respiratory distress.  Skin:    General: Skin is warm and dry.     Findings: No rash.  Neurological:     Mental Status: She is alert and oriented to person, place, and time.  Psychiatric:        Mood and Affect: Mood normal.        Behavior: Behavior normal.     Wt Readings from Last 3 Encounters:  09/18/22 173 lb (78.5 kg)  04/23/22 168 lb 9.6 oz (76.5 kg)  04/08/22 168 lb (76.2 kg)    BP 134/72   Pulse 79   Temp 98 F (36.7 C) (Oral)   Ht 5' (1.524 m)   Wt 173 lb (78.5 kg)   SpO2 98%   BMI 33.79 kg/m   Assessment and Plan:  Problem List Items Addressed This Visit   None Visit Diagnoses     Pharyngitis, unspecified etiology    -  Primary   likely viral but prolonged symptoms so will treat with antibiotics   Relevant Medications   amoxicillin (AMOXIL) 875 MG tablet   Acute cough       continue Robitussin DM every 6 hours can also take Xyzal daily for exacerbating PND   Mass of left parotid gland       surgery planned for October       No follow-ups on file.    Reubin Milan, MD [facility] [place]

## 2022-09-18 NOTE — Patient Instructions (Signed)
Try antihistamine sample once per day to see if you benefit.

## 2022-09-18 NOTE — Telephone Encounter (Signed)
Called pt could not leave VM. Called to reschedule pt appt for 10/4 to another day. Dr. Judithann Graves will be out of the office.  KP

## 2022-09-26 ENCOUNTER — Ambulatory Visit: Payer: Self-pay | Admitting: *Deleted

## 2022-09-26 NOTE — Telephone Encounter (Signed)
Summary: COVID Shot advice   Pt is calling to report that she has one more day on her antibotic. And has surgery scheduled for 10/20/2022. Can she get her COVID shot before her surgery? Please advise     Reason for Disposition  COVID-19 vaccine, Frequently Asked Questions (FAQs)  Answer Assessment - Initial Assessment Questions 1. MAIN CONCERN OR SYMPTOM:  "What is your main concern right now?" "What question do you have?" "What's the main symptom you're worried about?" (e.g., fever, pain, redness, swelling)     Patient would like to get COVID vaccine and flu vaccine  6. PAST REACTIONS: "Have you reacted to immunizations before?" If Yes, ask: "What happened?"     Very mild 7. OTHER SYMPTOMS: "Do you have any other symptoms?" (e.g., fatigue, headache, joint or muscle pain)     Patient is recovering from URI- and she is better- she plans to go for vaccines next week- she gets both at [facility]. Patient advised that should be fine- I will make sure PCP is aware.  Protocols used: Coronavirus (COVID-19) Vaccine Questions and Reactions-A-AH

## 2022-09-26 NOTE — Telephone Encounter (Signed)
  Chief Complaint: Vaccine question Symptoms: Patient is feeling better and has one mor dose of her antibiotic- reports lingering congestion- but much better. She plans to get COVID and Flu vaccine next week-just wanted to make sure that would be good timing with surgery date approaching.   Disposition: [] ED /[] Urgent Care (no appt availability in office) / [] Appointment(In office/virtual)/ []  [facility]/ [x] Home Care/ [] Refused Recommended Disposition /[] [facility]/ []  Follow-up with PCP Additional Notes: Patient advised the timing is good- immunity usually takes 2 weeks- so she should be fine. Patient states her symptoms are better from URI and she only has had mild SE in the past with vaccine so she should be able to tell the difference.  Patient plans to go to [facility] for both-advised I would make provider aware- glad she is better and good wishes for successful surgery.

## 2022-09-26 NOTE — Telephone Encounter (Signed)
Noted  KP

## 2022-10-10 ENCOUNTER — Ambulatory Visit: Payer: Medicare Other | Admitting: Internal Medicine

## 2022-10-30 DIAGNOSIS — H6123 Impacted cerumen, bilateral: Secondary | ICD-10-CM | POA: Diagnosis not present

## 2022-10-30 DIAGNOSIS — D37030 Neoplasm of uncertain behavior of the parotid salivary glands: Secondary | ICD-10-CM | POA: Diagnosis not present

## 2022-11-16 ENCOUNTER — Other Ambulatory Visit: Payer: Self-pay | Admitting: Internal Medicine

## 2022-11-16 DIAGNOSIS — I10 Essential (primary) hypertension: Secondary | ICD-10-CM

## 2022-11-16 DIAGNOSIS — M62838 Other muscle spasm: Secondary | ICD-10-CM

## 2022-11-17 ENCOUNTER — Telehealth: Payer: Self-pay | Admitting: Internal Medicine

## 2022-11-17 ENCOUNTER — Other Ambulatory Visit: Payer: Self-pay

## 2022-11-17 DIAGNOSIS — I10 Essential (primary) hypertension: Secondary | ICD-10-CM

## 2022-11-17 MED ORDER — LISINOPRIL 10 MG PO TABS: 10.0000 mg | ORAL_TABLET | Freq: Every day | ORAL | 1 refills | Status: DC

## 2022-11-17 NOTE — Telephone Encounter (Signed)
Please review.  KP

## 2022-11-17 NOTE — Telephone Encounter (Signed)
Patient informed. Wanted refill on lisinopril.   - [place]

## 2022-11-17 NOTE — Telephone Encounter (Signed)
Copied from CRM [id number]. Topic: General - Other >> Nov 17, 2022 11:43 AM Franchot Heidelberg wrote: Reason for CRM: Pt is highly allergic and cannot take baclofen, she also says that she cannot take the medication for incontinence. Says baclofen almost killed her when she took it in the past.

## 2022-11-18 NOTE — Telephone Encounter (Signed)
Requested medication (s) are due for refill today:   No for Baclofen 10 mg;   Lisinopril duplicate request  Requested medication (s) are on the active medication list:   No for Baclofen;   Yes for Lisinopril 10 mg  Future visit scheduled:   Yes 12/17/2022   Last ordered: Baclofen not on medication list;    Lisinopril 11/17/2022 #90, 1 refill - This is a duplicate.    Requested Prescriptions  Pending Prescriptions Disp Refills   baclofen (LIORESAL) 10 MG tablet [Pharmacy Med Name: BACLOFEN 10MG  TABLETS] 30 tablet     Sig: TAKE 1 TABLET(10 MG) BY MOUTH AT BEDTIME     Analgesics:  Muscle Relaxants - baclofen Failed - 11/16/2022 12:27 PM      Failed - Cr in normal range and within 180 days    Creatinine  Date Value Ref Range Status  11/20/2011 1.01 0.60 - 1.30 mg/dL Final   Creatinine, Ser  Date Value Ref Range Status  04/08/2022 0.87 0.57 - 1.00 mg/dL Final         Failed - eGFR is 30 or above and within 180 days    EGFR (African American)  Date Value Ref Range Status  11/20/2011 >60  Final   GFR calc Af Amer  Date Value Ref Range Status  03/09/2019 56 (L) >59 mL/min/1.73 Final   EGFR (Non-African Amer.)  Date Value Ref Range Status  11/20/2011 56 (L)  Final    Comment:    eGFR values <32mL/min/1.73 m2 may be an indication of chronic kidney disease (CKD). Calculated eGFR is useful in patients with stable renal function. The eGFR calculation will not be reliable in acutely ill patients when serum creatinine is changing rapidly. It is not useful in  patients on dialysis. The eGFR calculation may not be applicable to patients at the low and high extremes of body sizes, pregnant women, and vegetarians.    GFR calc non Af Amer  Date Value Ref Range Status  03/09/2019 49 (L) >59 mL/min/1.73 Final   eGFR  Date Value Ref Range Status  04/08/2022 66 >59 mL/min/1.73 Final         Passed - Valid encounter within last 6 months    Recent Outpatient Visits           2  months ago Pharyngitis, unspecified etiology   [place] [facility] at [facility], Nyoka Cowden, MD   6 months ago Lymphadenopathy   [facility] at [facility], Nyoka Cowden, MD   7 months ago Annual physical exam   [facility] at [facility], Nyoka Cowden, MD   1 year ago Muscle spasms of neck   [place] [facility] at [facility], Nyoka Cowden, MD   1 year ago Dysuria   [facility] at [facility], Nyoka Cowden, MD       Future Appointments             In 4 weeks Judithann Graves Nyoka Cowden, MD [facility] at [facility]             lisinopril (ZESTRIL) 10 MG tablet [Pharmacy Med Name: LISINOPRIL 10MG  TABLETS] 90 tablet 1    Sig: TAKE 1 TABLET(10 MG) BY MOUTH DAILY     Cardiovascular:  ACE Inhibitors Failed - 11/16/2022 12:27 PM  Failed - Cr in normal range and within 180 days    Creatinine  Date Value Ref Range Status  11/20/2011 1.01 0.60 - 1.30 mg/dL Final   Creatinine, Ser  Date Value Ref Range Status  04/08/2022 0.87 0.57 - 1.00 mg/dL Final         Failed - K in normal range and within 180 days    Potassium  Date Value Ref Range Status  04/08/2022 5.3 (H) 3.5 - 5.2 mmol/L Final  11/20/2011 3.9 3.5 - 5.1 mmol/L Final         Passed - Patient is not pregnant      Passed - Last BP in normal range    BP Readings from Last 1 Encounters:  09/18/22 134/72         Passed - Valid encounter within last 6 months    Recent Outpatient Visits           2 months ago Pharyngitis, unspecified etiology   [place] [facility] at [facility], Nyoka Cowden, MD   6 months ago Lymphadenopathy   [facility] at [facility], Nyoka Cowden, MD   7 months ago Annual physical exam   [facility] at [facility], Nyoka Cowden, MD   1 year ago Muscle spasms of neck   [place] [facility] at [facility], Nyoka Cowden, MD   1 year ago Dysuria   [facility] at [facility], Nyoka Cowden, MD       Future Appointments             In 4 weeks Judithann Graves Nyoka Cowden, MD [facility] at [facility], [facility]

## 2022-11-24 DIAGNOSIS — Z91041 Radiographic dye allergy status: Secondary | ICD-10-CM | POA: Diagnosis not present

## 2022-11-24 DIAGNOSIS — Z885 Allergy status to narcotic agent status: Secondary | ICD-10-CM | POA: Diagnosis not present

## 2022-11-24 DIAGNOSIS — Z8673 Personal history of transient ischemic attack (TIA), and cerebral infarction without residual deficits: Secondary | ICD-10-CM | POA: Diagnosis not present

## 2022-11-24 DIAGNOSIS — D110 Benign neoplasm of parotid gland: Secondary | ICD-10-CM | POA: Diagnosis not present

## 2022-11-24 DIAGNOSIS — K219 Gastro-esophageal reflux disease without esophagitis: Secondary | ICD-10-CM | POA: Diagnosis not present

## 2022-11-24 DIAGNOSIS — Z888 Allergy status to other drugs, medicaments and biological substances status: Secondary | ICD-10-CM | POA: Diagnosis not present

## 2022-11-24 DIAGNOSIS — R221 Localized swelling, mass and lump, neck: Secondary | ICD-10-CM | POA: Diagnosis not present

## 2022-11-24 DIAGNOSIS — D367 Benign neoplasm of other specified sites: Secondary | ICD-10-CM | POA: Diagnosis not present

## 2022-11-24 DIAGNOSIS — Z882 Allergy status to sulfonamides status: Secondary | ICD-10-CM | POA: Diagnosis not present

## 2022-11-24 DIAGNOSIS — K118 Other diseases of salivary glands: Secondary | ICD-10-CM | POA: Diagnosis not present

## 2022-11-24 DIAGNOSIS — I10 Essential (primary) hypertension: Secondary | ICD-10-CM | POA: Diagnosis not present

## 2022-12-10 DIAGNOSIS — Z09 Encounter for follow-up examination after completed treatment for conditions other than malignant neoplasm: Secondary | ICD-10-CM | POA: Diagnosis not present

## 2022-12-10 DIAGNOSIS — R221 Localized swelling, mass and lump, neck: Secondary | ICD-10-CM | POA: Diagnosis not present

## 2022-12-17 ENCOUNTER — Ambulatory Visit: Payer: Medicare Other | Admitting: Internal Medicine

## 2023-01-27 ENCOUNTER — Telehealth: Payer: Self-pay | Admitting: Internal Medicine

## 2023-01-27 NOTE — Telephone Encounter (Signed)
Read previous message  

## 2023-01-27 NOTE — Telephone Encounter (Signed)
Copied from CRM [id number]. Topic: General - Other >> Jan 27, 2023 12:37 PM Everette C wrote: Reason for CRM: Joni Reining with [facility] has called to request a member of staff contact the patient to confirm insurance status and verification, confirming that the patient's insurance will still be accepted at the practice  Please contact [phone] further if needed

## 2023-03-02 ENCOUNTER — Other Ambulatory Visit: Payer: Self-pay | Admitting: Internal Medicine

## 2023-03-02 DIAGNOSIS — Z1231 Encounter for screening mammogram for malignant neoplasm of breast: Secondary | ICD-10-CM

## 2023-04-08 ENCOUNTER — Ambulatory Visit
Admission: RE | Admit: 2023-04-08 | Discharge: 2023-04-08 | Disposition: A | Discharge status: Home / Self Care | Payer: Medicare PPO | Source: Ambulatory Visit | Attending: Internal Medicine | Admitting: Internal Medicine

## 2023-04-08 ENCOUNTER — Ambulatory Visit (INDEPENDENT_AMBULATORY_CARE_PROVIDER_SITE_OTHER): Payer: Medicare PPO

## 2023-04-08 ENCOUNTER — Telehealth: Payer: Self-pay

## 2023-04-08 VITALS — BP 136/82 | Ht 60.0 in | Wt 183.2 lb

## 2023-04-08 DIAGNOSIS — Z0000 Encounter for general adult medical examination without abnormal findings: Secondary | ICD-10-CM | POA: Diagnosis not present

## 2023-04-08 DIAGNOSIS — Z1231 Encounter for screening mammogram for malignant neoplasm of breast: Secondary | ICD-10-CM | POA: Insufficient documentation

## 2023-04-08 NOTE — Telephone Encounter (Signed)
 Copied from CRM [id number]. Topic: Clinical - Medication Question >> Apr 08, 2023  1:41 PM Victorino Dike T wrote: Reason for CRM: meloxicam (MOBIC) 15 MG tablet and Zinc 20 MG CAPS - need to remove from med list- states she does not take these  [phone]

## 2023-04-08 NOTE — Patient Instructions (Addendum)
 Maria Gibson , Thank you for taking time to come for your Medicare Wellness Visit. I appreciate your ongoing commitment to your health goals. Please review the following plan we discussed and let me know if I can assist you in the future.   Referrals/Orders/Follow-Ups/Clinician Recommendations: NONE   This is a list of the screening recommended for you and due dates:  Health Maintenance  Topic Date Due   COVID-19 Vaccine (6 - 2024-25 season) 09/07/2022   Mammogram  04/07/2023   Flu Shot  08/07/2023   Medicare Annual Wellness Visit  04/07/2024   DEXA scan (bone density measurement)  03/22/2025   DTaP/Tdap/Td vaccine (2 - Td or Tdap) 09/18/2030   Pneumonia Vaccine  Completed   Zoster (Shingles) Vaccine  Completed   HPV Vaccine  Aged Out    Advanced directives: (ACP Link)Information on Advanced Care Planning can be found at [facility] of [place] Advance Health Care Directives Advance Health Care Directives. [email or web address]   Next Medicare Annual Wellness Visit scheduled for next year: Yes   04/13/24 @ 9:30 AM IN PERSON

## 2023-04-08 NOTE — Progress Notes (Signed)
 Subjective:   Meggin Ola is a 83 y.o. who presents for a Medicare Wellness preventive visit.  Visit Complete: In person   Persons Participating in Visit: Patient.  AWV Questionnaire: No: Patient Medicare AWV questionnaire was not completed prior to this visit.  Cardiac Risk Factors include: advanced age (>40men, >39 women);dyslipidemia;hypertension;obesity (BMI >30kg/m2);sedentary lifestyle     Objective:    Today's Vitals   04/08/23 0920  BP: 136/82  Weight: 183 lb 3.2 oz (83.1 kg)  Height: 5' (1.524 m)   Body mass index is 35.78 kg/m.     04/08/2023    9:32 AM 03/12/2022    9:25 AM 03/06/2021    8:51 AM 02/03/2021    8:12 AM 03/05/2020    9:04 AM 03/02/2019    8:28 AM 06/24/2017    8:32 AM  Advanced Directives  Does Patient Have a Medical Advance Directive? Yes Yes Yes Yes Yes Yes Yes  Type of Estate agent of [place];Living will Healthcare Power of [place];Living will Healthcare Power of [place];Living will  Healthcare Power of [place];Living will Healthcare Power of [place];Living will Healthcare Power of [place];Living will  Does patient want to make changes to medical advance directive? No - Patient declined No - Patient declined       Copy of Healthcare Power of Attorney in Chart? Yes - validated most recent copy scanned in chart (See row information) Yes - validated most recent copy scanned in chart (See row information) Yes - validated most recent copy scanned in chart (See row information)  Yes - validated most recent copy scanned in chart (See row information) No - copy requested No - copy requested    Current Medications (verified) Outpatient Encounter Medications as of 04/08/2023  Medication Sig   Calcium Carb-Cholecalciferol (CALCIUM + D3) 600-200 MG-UNIT TABS Take by mouth.   COLLAGEN PO Take by mouth. Collagen protein powder mixed with coffee daily in AM   lisinopril (ZESTRIL) 10 MG tablet Take 1 tablet (10 mg total) by mouth  daily.   meloxicam (MOBIC) 15 MG tablet meloxicam 15 mg tablet   Multiple Vitamins-Minerals (MULTIVITAMIN ADULTS 50+ PO) Take by mouth.   OVER THE COUNTER MEDICATION Lectin shield gundry supplement BID   OVER THE COUNTER MEDICATION Bio complete 3 probiotic gundry supply   Zinc 20 MG CAPS TAKE 4 CAPSULES BY MOUTH 1 HOUR BEFORE APPOINTMENT   aspirin 325 MG EC tablet Take 325 mg by mouth daily. (Patient not taking: Reported on 04/08/2023)   No facility-administered encounter medications on file as of 04/08/2023.    Allergies (verified) Baclofen, Codeine, Iodine, Morphine sulfate, Shellfish allergy, and Sulfa antibiotics   History: Past Medical History:  Diagnosis Date   Hypertension    Past Surgical History:  Procedure Laterality Date   INTRACAPSULAR CATARACT EXTRACTION Bilateral 2017   KNEE SURGERY     REVERSE TOTAL SHOULDER ARTHROPLASTY Right 07/2018   TOTAL SHOULDER REPLACEMENT Left    TUBAL LIGATION     VARICOSE VEIN SURGERY     Family History  Problem Relation Age of Onset   Heart failure Mother    Diabetes Father    Heart failure Father    Breast cancer Neg Hx    Social History   Socioeconomic History   Marital status: Widowed    Spouse name: Not on file   Number of children: 2   Years of education: Not on file   Highest education level: 12th grade  Occupational History   Occupation: Retired  Tobacco Use   Smoking status: Never   Smokeless tobacco: Never   Tobacco comments:    smoking cessation materials not required  Vaping Use   Vaping status: Never Used  Substance and Sexual Activity   Alcohol use: Yes    Alcohol/week: 1.0 standard drink of alcohol    Types: 1 Glasses of wine per week    Comment: occasional   Drug use: No   Sexual activity: Not Currently  Other Topics Concern   Not on file  Social History Narrative   Pt lives alone   Social Drivers of Health   Financial Resource Strain: Low Risk  (04/08/2023)   Overall Financial Resource Strain  (CARDIA)    Difficulty of Paying Living Expenses: Not hard at all  Food Insecurity: No Food Insecurity (04/08/2023)   Hunger Vital Sign    Worried About Running Out of Food in the Last Year: Never true    Ran Out of Food in the Last Year: Never true  Transportation Needs: No Transportation Needs (04/08/2023)   PRAPARE - Administrator, Civil Service (Medical): No    Lack of Transportation (Non-Medical): No  Physical Activity: Insufficiently Active (04/08/2023)   Exercise Vital Sign    Days of Exercise per Week: 2 days    Minutes of Exercise per Session: 20 min  Stress: No Stress Concern Present (04/08/2023)   [employer] of Occupational Health - Occupational Stress Questionnaire    Feeling of Stress : Not at all  Social Connections: Moderately Isolated (04/08/2023)   Social Connection and Isolation Panel [NHANES]    Frequency of Communication with Friends and Family: Three times a week    Frequency of Social Gatherings with Friends and Family: Twice a week    Attends Religious Services: Never    Database administrator or Organizations: Yes    Attends Engineer, structural: More than 4 times per year    Marital Status: Widowed    Tobacco Counseling Counseling given: Not Answered Tobacco comments: smoking cessation materials not required    Clinical Intake:  Pre-visit preparation completed: Yes  Pain : No/denies pain     BMI - recorded: 35.78 Nutritional Status: BMI > 30  Obese Nutritional Risks: None Diabetes: No  Lab Results  Component Value Date   HGBA1C 5.4 03/15/2021     How often do you need to have someone help you when you read instructions, pamphlets, or other written materials from your doctor or pharmacy?: 1 - Never  Interpreter Needed?: No  Information entered by :: Kennedy Bucker, LPN   Activities of Daily Living    04/08/2023    9:34 AM  In your present state of health, do you have any difficulty performing the following  activities:  Hearing? 1  Vision? 0  Difficulty concentrating or making decisions? 0  Walking or climbing stairs? 0  Comment WALKS DOWN BACKWARDS B/C EASIER ON KNEES  Dressing or bathing? 0  Doing errands, shopping? 0  Preparing Food and eating ? N  Using the Toilet? N  In the past six months, have you accidently leaked urine? N  Do you have problems with loss of bowel control? N  Managing your Medications? N  Managing your Finances? N  Housekeeping or managing your Housekeeping? N    Patient Care Team: Reubin Milan, MD as PCP - General (Internal Medicine) Vernie Murders, MD (Otolaryngology) Scroggs, Jiles Harold, MD (Ophthalmology)  Indicate any recent Medical Services you may have received  from other than [facility] providers in the past year (date may be approximate).     Assessment:   This is a routine wellness examination for Maricella.  Hearing/Vision screen Hearing Screening - Comments:: WEARS AIDS, BOTH EARS Vision Screening - Comments:: WEARS GLASSES ALL THE TIME- [email or web address] (THE MD REPLACING HIM)    Goals Addressed             This Visit's Progress    Cut out extra servings         Depression Screen     04/08/2023    9:29 AM 04/23/2022   10:18 AM 04/08/2022   10:29 AM 03/12/2022    9:23 AM 09/24/2021    9:47 AM 07/02/2021   11:11 AM 03/15/2021    9:53 AM  PHQ 2/9 Scores  PHQ - 2 Score 0 0 0 0 0 0 0  PHQ- 9 Score 2 1 3  0 2 1 2     Fall Risk     04/08/2023    9:34 AM 04/23/2022   10:18 AM 04/08/2022   10:29 AM 03/12/2022    9:26 AM 09/24/2021    9:47 AM  Fall Risk   Falls in the past year? 0 0 0 0 0  Number falls in past yr: 0 0 0 0 0  Injury with Fall? 0 0 0 0 0  Risk for fall due to : No Fall Risks No Fall Risks No Fall Risks No Fall Risks No Fall Risks  Follow up Falls prevention discussed;Falls evaluation completed Falls evaluation completed Falls evaluation completed Falls prevention discussed;Falls evaluation completed Falls evaluation completed    MEDICARE  RISK AT HOME:  Medicare Risk at Home Any stairs in or around the home?: Yes If so, are there any without handrails?: No Home free of loose throw rugs in walkways, pet beds, electrical cords, etc?: Yes Adequate lighting in your home to reduce risk of falls?: Yes Life alert?: No Use of a cane, walker or w/c?: Yes (RARELY, IF LONG WALK USES WALKER) Grab bars in the bathroom?: Yes Shower chair or bench in shower?: No Elevated toilet seat or a handicapped toilet?: Yes  TIMED UP AND GO:  Was the test performed?  Yes  Length of time to ambulate 10 feet: 4 sec Gait steady and fast without use of assistive device  Cognitive Function: 6CIT completed        04/08/2023    9:37 AM 03/12/2022    9:32 AM 03/02/2019    8:31 AM 06/24/2017    8:36 AM 06/16/2016    9:15 AM  6CIT Screen  What Year? 0 points 0 points 0 points 0 points 0 points  What month? 0 points 0 points 0 points 0 points 0 points  What time? 0 points 0 points 0 points 0 points 0 points  Count back from 20 0 points 0 points 0 points 0 points 0 points  Months in reverse 0 points 0 points 0 points 0 points 0 points  Repeat phrase 4 points 0 points 0 points 0 points 0 points  Total Score 4 points 0 points 0 points 0 points 0 points    Immunizations Immunization History  Administered Date(s) Administered   Fluad Quad(high Dose 65+) 10/09/2019, 09/24/2021   Influenza Split 10/06/2022   Influenza, High Dose Seasonal PF 10/06/2017, 10/27/2018, 10/08/2020   Influenza,inj,quad, With Preservative 09/07/2015   Influenza-Unspecified 10/07/2014, 09/06/2016, 10/27/2018   Moderna SARS-COV2 Booster Vaccination 07/04/2020, 09/17/2020   PFIZER(Purple Top)SARS-COV-2 Vaccination 02/04/2019, 02/28/2019, 10/09/2019  Pneumococcal Conjugate-13 11/21/2013   Pneumococcal Polysaccharide-23 01/07/2006, 03/06/2015   Pneumococcal-Unspecified 03/06/2015   Tdap 09/17/2020   Zoster Recombinant(Shingrix) 04/01/2016, 06/16/2016   Zoster, Live 01/08/2007     Screening Tests Health Maintenance  Topic Date Due   COVID-19 Vaccine (6 - 2024-25 season) 09/07/2022   MAMMOGRAM  04/07/2023   INFLUENZA VACCINE  08/07/2023   Medicare Annual Wellness (AWV)  04/07/2024   DEXA SCAN  03/22/2025   DTaP/Tdap/Td (2 - Td or Tdap) 09/18/2030   Pneumonia Vaccine 69+ Years old  Completed   Zoster Vaccines- Shingrix  Completed   HPV VACCINES  Aged Out    Health Maintenance  Health Maintenance Due  Topic Date Due   COVID-19 Vaccine (6 - 2024-25 season) 09/07/2022   MAMMOGRAM  04/07/2023   Health Maintenance Items Addressed: UP TO DATE ON SHOTS, MAMMOGRAM & BDS- AGED OUT OF COLONOSCOPY  Additional Screening:  Vision Screening: Recommended annual ophthalmology exams for early detection of glaucoma and other disorders of the eye.  Dental Screening: Recommended annual dental exams for proper oral hygiene  Community Resource Referral / Chronic Care Management: CRR required this visit?  No   CCM required this visit?  No     Plan:     I have personally reviewed and noted the following in the patient's chart:   Medical and social history Use of alcohol, tobacco or illicit drugs  Current medications and supplements including opioid prescriptions. Patient is not currently taking opioid prescriptions. Functional ability and status Nutritional status Physical activity Advanced directives List of other physicians Hospitalizations, surgeries, and ER visits in previous 12 months Vitals Screenings to include cognitive, depression, and falls Referrals and appointments  In addition, I have reviewed and discussed with patient certain preventive protocols, quality metrics, and best practice recommendations. A written personalized care plan for preventive services as well as general preventive health recommendations were provided to patient.     Hal Hope, LPN   04/12/4257   After Visit Summary: (In Person-Declined) Patient declined AVS at  this time.  Notes: Nothing significant to report at this time.

## 2023-04-08 NOTE — Telephone Encounter (Signed)
 Medication list updated.

## 2023-05-17 ENCOUNTER — Other Ambulatory Visit: Payer: Self-pay | Admitting: Internal Medicine

## 2023-05-17 DIAGNOSIS — I10 Essential (primary) hypertension: Secondary | ICD-10-CM

## 2023-05-19 NOTE — Telephone Encounter (Signed)
 Requested medications are due for refill today.  yes  Requested medications are on the active medications list.  yes  Last refill. 11/17/2022 #90 1 rf  Future visit scheduled.   no  Notes to clinic.  Labs are expired. Pt needs an OV.    Requested Prescriptions  Pending Prescriptions Disp Refills   lisinopril  (ZESTRIL ) 10 MG tablet [Pharmacy Med Name: LISINOPRIL  10MG  TABLETS] 90 tablet 1    Sig: TAKE 1 TABLET(10 MG) BY MOUTH DAILY     Cardiovascular:  ACE Inhibitors Failed - 05/19/2023 12:40 PM      Failed - Cr in normal range and within 180 days    Creatinine  Date Value Ref Range Status  11/20/2011 1.01 0.60 - 1.30 mg/dL Final   Creatinine, Ser  Date Value Ref Range Status  04/08/2022 0.87 0.57 - 1.00 mg/dL Final         Failed - K in normal range and within 180 days    Potassium  Date Value Ref Range Status  04/08/2022 5.3 (H) 3.5 - 5.2 mmol/L Final  11/20/2011 3.9 3.5 - 5.1 mmol/L Final         Failed - Valid encounter within last 6 months    Recent Outpatient Visits   None            Passed - Patient is not pregnant      Passed - Last BP in normal range    BP Readings from Last 1 Encounters:  04/08/23 136/82

## 2023-05-21 ENCOUNTER — Other Ambulatory Visit: Payer: Self-pay | Admitting: Internal Medicine

## 2023-05-21 DIAGNOSIS — I10 Essential (primary) hypertension: Secondary | ICD-10-CM

## 2023-05-22 NOTE — Telephone Encounter (Signed)
 Called to patient- scheduled medication follow up- courtesy refill given Requested Prescriptions  Pending Prescriptions Disp Refills   lisinopril  (ZESTRIL ) 10 MG tablet [Pharmacy Med Name: LISINOPRIL  10MG  TABLETS] 90 tablet 1    Sig: TAKE 1 TABLET(10 MG) BY MOUTH DAILY     Cardiovascular:  ACE Inhibitors Failed - 05/22/2023  2:17 PM      Failed - Cr in normal range and within 180 days    Creatinine  Date Value Ref Range Status  11/20/2011 1.01 0.60 - 1.30 mg/dL Final   Creatinine, Ser  Date Value Ref Range Status  04/08/2022 0.87 0.57 - 1.00 mg/dL Final         Failed - K in normal range and within 180 days    Potassium  Date Value Ref Range Status  04/08/2022 5.3 (H) 3.5 - 5.2 mmol/L Final  11/20/2011 3.9 3.5 - 5.1 mmol/L Final         Failed - Valid encounter within last 6 months    Recent Outpatient Visits   None            Passed - Patient is not pregnant      Passed - Last BP in normal range    BP Readings from Last 1 Encounters:  04/08/23 136/82

## 2023-05-26 ENCOUNTER — Other Ambulatory Visit: Payer: Self-pay | Admitting: Internal Medicine

## 2023-05-26 ENCOUNTER — Encounter: Payer: Self-pay | Admitting: Internal Medicine

## 2023-05-26 ENCOUNTER — Ambulatory Visit: Admitting: Internal Medicine

## 2023-05-26 VITALS — BP 138/82 | HR 76 | Ht 60.0 in | Wt 184.0 lb

## 2023-05-26 DIAGNOSIS — Z1211 Encounter for screening for malignant neoplasm of colon: Secondary | ICD-10-CM | POA: Diagnosis not present

## 2023-05-26 DIAGNOSIS — I10 Essential (primary) hypertension: Secondary | ICD-10-CM

## 2023-05-26 MED ORDER — LISINOPRIL 10 MG PO TABS: 10.0000 mg | ORAL_TABLET | Freq: Every day | ORAL | 1 refills | Status: DC

## 2023-05-26 NOTE — Progress Notes (Unsigned)
 Date:  05/26/2023   Name:  Maria Gibson   DOB:  [birth year]   MRN:  [id number]   Chief Complaint: No chief complaint on file.  HPI  Review of Systems   Lab Results  Component Value Date   NA 140 04/08/2022   K 5.3 (H) 04/08/2022   CO2 27 04/08/2022   GLUCOSE 93 04/08/2022   BUN 27 04/08/2022   CREATININE 0.87 04/08/2022   CALCIUM 9.2 04/08/2022   EGFR 66 04/08/2022   GFRNONAA 49 (L) 03/09/2019   Lab Results  Component Value Date   CHOL 182 04/08/2022   HDL 94 04/08/2022   LDLCALC 78 04/08/2022   TRIG 52 04/08/2022   CHOLHDL 1.9 04/08/2022   Lab Results  Component Value Date   TSH 1.750 04/08/2022   Lab Results  Component Value Date   HGBA1C 5.4 03/15/2021   Lab Results  Component Value Date   WBC 7.1 04/08/2022   HGB 14.6 04/08/2022   HCT 44.2 04/08/2022   MCV 95 04/08/2022   PLT 206 04/08/2022   Lab Results  Component Value Date   ALT 18 04/08/2022   AST 17 04/08/2022   ALKPHOS 71 04/08/2022   BILITOT 0.4 04/08/2022   No results found for: "25OHVITD2", "25OHVITD3", "VD25OH"   Patient Active Problem List   Diagnosis Date Noted   Artificial knee joint present 12/23/2018   Osteoarthritis of right shoulder 03/06/2017   Arthritis of right knee 04/15/2016   Blepharoptosis, acquired, left 04/15/2016   OA (osteoarthritis) of ankle 10/17/2015   Essential (primary) hypertension 12/01/2014   GERD without esophagitis 12/01/2014   Hypertriglyceridemia 12/01/2014   Degenerative arthritis of finger 12/01/2014   Urge incontinence of urine 12/01/2014   Sequelae of cerebral infarction 12/01/2014    Allergies  Allergen Reactions   Baclofen  Swelling    hands   Codeine     Iodine    Morphine Sulfate    Shellfish Allergy    Sulfa Antibiotics Nausea And Vomiting    Past Surgical History:  Procedure Laterality Date   INTRACAPSULAR CATARACT EXTRACTION Bilateral 2017   KNEE SURGERY     REVERSE TOTAL SHOULDER ARTHROPLASTY Right 07/2018   TOTAL  SHOULDER REPLACEMENT Left    TUBAL LIGATION     VARICOSE VEIN SURGERY      Social History   Tobacco Use   Smoking status: Never   Smokeless tobacco: Never   Tobacco comments:    smoking cessation materials not required  Vaping Use   Vaping status: Never Used  Substance Use Topics   Alcohol use: Yes    Alcohol/week: 1.0 standard drink of alcohol    Types: 1 Glasses of wine per week    Comment: occasional   Drug use: No     Medication list has been reviewed and updated.  No outpatient medications have been marked as taking for the 05/26/23 encounter (Orders Only) with Maria Dixons, MD.       04/23/2022   10:19 AM 04/08/2022   10:29 AM 09/24/2021    9:47 AM 07/02/2021   11:12 AM  GAD 7 : Generalized Anxiety Score  Nervous, Anxious, on Edge 1 1 1  0  Control/stop worrying 0 1 0 0  Worry too much - different things 0 1 0 0  Trouble relaxing 0 0 1 0  Restless 0 0 0 0  Easily annoyed or irritable 0 0 0 0  Afraid - awful might happen 0 0 1 0  Total GAD  7 Score 1 3 3  0  Anxiety Difficulty Not difficult at all Not difficult at all Not difficult at all Not difficult at all       04/08/2023    9:29 AM 04/23/2022   10:18 AM 04/08/2022   10:29 AM  Depression screen PHQ 2/9  Decreased Interest 0 0 0  Down, Depressed, Hopeless 0 0 0  PHQ - 2 Score 0 0 0  Altered sleeping 1 0 1  Tired, decreased energy 1 1 1   Change in appetite 0 0 0  Feeling bad or failure about yourself  0 0 0  Trouble concentrating 0 0 0  Moving slowly or fidgety/restless 0 0 1  Suicidal thoughts 0 0 0  PHQ-9 Score 2 1 3   Difficult doing work/chores Not difficult at all Not difficult at all Somewhat difficult    BP Readings from Last 3 Encounters:  04/08/23 136/82  09/18/22 134/72  04/23/22 126/78    Physical Exam  Wt Readings from Last 3 Encounters:  04/08/23 183 lb 3.2 oz (83.1 kg)  09/18/22 173 lb (78.5 kg)  04/23/22 168 lb 9.6 oz (76.5 kg)    There were no vitals taken for this  visit.  Assessment and Plan:  Problem List Items Addressed This Visit   None   No follow-ups on file.    Maria Dixons, MD [facility] [place]

## 2023-05-26 NOTE — Assessment & Plan Note (Signed)
 Blood pressure is well controlled.  Current medications are lisinopril. Will continue same regimen along with efforts to limit dietary sodium.

## 2023-05-26 NOTE — Progress Notes (Signed)
 Date:  05/26/2023   Name:  Maria Gibson   DOB:  03/03/1940   MRN:  [id number]   Chief Complaint: Hypertension (F/up, no other concerns)  Hypertension This is a chronic problem. The problem is controlled. Pertinent negatives include no chest pain, palpitations or shortness of breath. Past treatments include ACE inhibitors. The current treatment provides significant improvement. There is no history of kidney disease, CAD/MI or CVA.  She feels well, eating very healthy organic vegetables, drinking goats milk and eating goats cheese.  Review of Systems  Constitutional:  Negative for chills, fatigue and fever.  Respiratory:  Negative for chest tightness and shortness of breath.   Cardiovascular:  Negative for chest pain and palpitations.  Musculoskeletal:  Negative for arthralgias.  Neurological:  Negative for dizziness and light-headedness.  Psychiatric/Behavioral:  Negative for dysphoric mood and sleep disturbance. The patient is not nervous/anxious.      Lab Results  Component Value Date   NA 140 04/08/2022   K 5.3 (H) 04/08/2022   CO2 27 04/08/2022   GLUCOSE 93 04/08/2022   BUN 27 04/08/2022   CREATININE 0.87 04/08/2022   CALCIUM 9.2 04/08/2022   EGFR 66 04/08/2022   GFRNONAA 49 (L) 03/09/2019   Lab Results  Component Value Date   CHOL 182 04/08/2022   HDL 94 04/08/2022   LDLCALC 78 04/08/2022   TRIG 52 04/08/2022   CHOLHDL 1.9 04/08/2022   Lab Results  Component Value Date   TSH 1.750 04/08/2022   Lab Results  Component Value Date   HGBA1C 5.4 03/15/2021   Lab Results  Component Value Date   WBC 7.1 04/08/2022   HGB 14.6 04/08/2022   HCT 44.2 04/08/2022   MCV 95 04/08/2022   PLT 206 04/08/2022   Lab Results  Component Value Date   ALT 18 04/08/2022   AST 17 04/08/2022   ALKPHOS 71 04/08/2022   BILITOT 0.4 04/08/2022   No results found for: "25OHVITD2", "25OHVITD3", "VD25OH"   Patient Active Problem List   Diagnosis Date Noted   Parotid  mass 07/30/2022   Artificial knee joint present 12/23/2018   Osteoarthritis of right shoulder 03/06/2017   Arthritis of right knee 04/15/2016   Blepharoptosis, acquired, left 04/15/2016   OA (osteoarthritis) of ankle 10/17/2015   Essential (primary) hypertension 12/01/2014   GERD without esophagitis 12/01/2014   Hypertriglyceridemia 12/01/2014   Degenerative arthritis of finger 12/01/2014   Urge incontinence of urine 12/01/2014   Sequelae of cerebral infarction 12/01/2014    Allergies  Allergen Reactions   Baclofen  Swelling    hands   Codeine     Iodine    Morphine Sulfate    Shellfish Allergy    Sulfa Antibiotics Nausea And Vomiting    Past Surgical History:  Procedure Laterality Date   INTRACAPSULAR CATARACT EXTRACTION Bilateral 2017   KNEE SURGERY     REVERSE TOTAL SHOULDER ARTHROPLASTY Right 07/2018   TOTAL SHOULDER REPLACEMENT Left    TUBAL LIGATION     VARICOSE VEIN SURGERY      Social History   Tobacco Use   Smoking status: Never   Smokeless tobacco: Never   Tobacco comments:    smoking cessation materials not required  Vaping Use   Vaping status: Never Used  Substance Use Topics   Alcohol use: Yes    Alcohol/week: 1.0 standard drink of alcohol    Types: 1 Glasses of wine per week    Comment: occasional   Drug use: No  Medication list has been reviewed and updated.  Current Meds  Medication Sig   acetaminophen (TYLENOL) 325 MG tablet Take 325 mg by mouth every 4 (four) hours as needed.   aspirin EC 81 MG tablet Take 1 tablet by mouth daily.   Calcium Carb-Cholecalciferol (CALCIUM + D3) 600-200 MG-UNIT TABS Take by mouth.   COLLAGEN PO Take by mouth. Collagen protein powder mixed with coffee daily in AM   Multiple Vitamins-Minerals (MULTIVITAMIN ADULTS 50+ PO) Take by mouth.   OVER THE COUNTER MEDICATION Lectin shield gundry supplement BID   OVER THE COUNTER MEDICATION Bio complete 3 probiotic gundry supply   [DISCONTINUED] lisinopril  (ZESTRIL )  10 MG tablet TAKE 1 TABLET(10 MG) BY MOUTH DAILY       05/26/2023    3:26 PM 04/23/2022   10:19 AM 04/08/2022   10:29 AM 09/24/2021    9:47 AM  GAD 7 : Generalized Anxiety Score  Nervous, Anxious, on Edge 0 1 1 1   Control/stop worrying 0 0 1 0  Worry too much - different things 1 0 1 0  Trouble relaxing 1 0 0 1  Restless 0 0 0 0  Easily annoyed or irritable 0 0 0 0  Afraid - awful might happen 0 0 0 1  Total GAD 7 Score 2 1 3 3   Anxiety Difficulty Not difficult at all Not difficult at all Not difficult at all Not difficult at all       05/26/2023    3:26 PM 04/08/2023    9:29 AM 04/23/2022   10:18 AM  Depression screen PHQ 2/9  Decreased Interest 0 0 0  Down, Depressed, Hopeless 0 0 0  PHQ - 2 Score 0 0 0  Altered sleeping 2 1 0  Tired, decreased energy 2 1 1   Change in appetite 0 0 0  Feeling bad or failure about yourself  0 0 0  Trouble concentrating 0 0 0  Moving slowly or fidgety/restless 0 0 0  Suicidal thoughts 0 0 0  PHQ-9 Score 4 2 1   Difficult doing work/chores Not difficult at all Not difficult at all Not difficult at all    BP Readings from Last 3 Encounters:  05/26/23 138/82  04/08/23 136/82  09/18/22 134/72    Physical Exam Vitals and nursing note reviewed.  Constitutional:      General: She is not in acute distress.    Appearance: Normal appearance. She is well-developed.  HENT:     Head: Normocephalic and atraumatic.  Neck:     Vascular: No carotid bruit.  Cardiovascular:     Rate and Rhythm: Normal rate and regular rhythm.  Pulmonary:     Effort: Pulmonary effort is normal. No respiratory distress.     Breath sounds: No wheezing or rhonchi.  Musculoskeletal:     Cervical back: Normal range of motion.     Right lower leg: No edema.     Left lower leg: No edema.  Lymphadenopathy:     Cervical: No cervical adenopathy.  Skin:    General: Skin is warm and dry.     Capillary Refill: Capillary refill takes less than 2 seconds.     Findings: No  rash.  Neurological:     General: No focal deficit present.     Mental Status: She is alert and oriented to person, place, and time.  Psychiatric:        Mood and Affect: Mood normal.        Behavior: Behavior normal.  Wt Readings from Last 3 Encounters:  05/26/23 184 lb (83.5 kg)  04/08/23 183 lb 3.2 oz (83.1 kg)  09/18/22 173 lb (78.5 kg)    BP 138/82   Pulse 76   Ht 5' (1.524 m)   Wt 184 lb (83.5 kg)   SpO2 98%   BMI 35.94 kg/m   Assessment and Plan:  Problem List Items Addressed This Visit       Unprioritized   Essential (primary) hypertension (Chronic)   Blood pressure is well controlled.  Current medications are lisinopril . Will continue same regimen along with efforts to limit dietary sodium.       Relevant Medications   aspirin EC 81 MG tablet   lisinopril  (ZESTRIL ) 10 MG tablet   Other Visit Diagnoses       Colon cancer screening    -  Primary   Relevant Orders   Fecal occult blood, imunochemical       Return in about 6 months (around 11/26/2023) for CPX.    [name], MD [facility] [place]

## 2023-06-04 LAB — SPECIMEN STATUS REPORT

## 2023-06-04 LAB — FECAL OCCULT BLOOD, IMMUNOCHEMICAL: Fecal Occult Bld: NEGATIVE

## 2023-06-05 ENCOUNTER — Ambulatory Visit: Payer: Self-pay | Admitting: Internal Medicine

## 2023-07-13 ENCOUNTER — Ambulatory Visit: Payer: Self-pay

## 2023-07-13 NOTE — Telephone Encounter (Signed)
 FYI Only or Action Required?: FYI only for provider.  Patient was last seen in primary care on 05/26/2023 by Justus Leita DEL, MD.  Called Nurse Triage reporting Heartburn.  Symptoms began several days ago.  Interventions attempted: OTC medications: Pepto Bismul/ TUMS.  Symptoms are: gradually improving.  Triage Disposition: Call PCP Within 24 Hours  Patient/caregiver understands and will follow disposition?: Yes   ** Appt. Scheduled with PCP on 7/8**                            Message from Durant R sent at 07/13/2023  2:28 PM EDT  Summary: Worsening Acid Reflux   Reason for Triage: Patient states Friday night she has Acid reflux so bad she almost went to the hospital. He son gave her some medicine and she has been using Tums. States it is more manageable now, but she is monitoring what she eats so nothing triggers it.  Patient can be reached at [phone]          Reason for Disposition  [1] Patient says chest pain feels exactly the same as previously diagnosed heartburn AND [2] describes burning in chest AND [3] accompanying sour taste in mouth  Answer Assessment - Initial Assessment Questions 1. LOCATION: Where does it hurt?       Upper chest  2. RADIATION: Does the pain go anywhere else? (e.g., into neck, jaw, arms, back)                No        3. ONSET: When did the chest pain begin? (Minutes, hours or days)      7/4/ 25  4. PATTERN: Does the pain come and go, or has it been constant since it started?  Does it get worse with exertion?      Intermittent   5. DURATION: How long does it last (e.g., seconds, minutes, hours)     Hours    6. SEVERITY: How bad is the pain?  (e.g., Scale 1-10; mild, moderate, or severe)    - MILD (1-3): doesn't interfere with normal activities     - MODERATE (4-7): interferes with normal activities or awakens from sleep    - SEVERE (8-10): excruciating pain, unable to do any normal  activities      Severe, not currently   7. CARDIAC RISK FACTORS: Do you have any history of heart problems or risk factors for heart disease? (e.g., angina, prior heart attack; diabetes, high blood pressure, high cholesterol, smoker, or strong family history of heart disease)     HTN  8. PULMONARY RISK FACTORS: Do you have any history of lung disease?  (e.g., blood clots in lung, asthma, emphysema, birth control pills)     No    9. CAUSE: What do you think is causing the chest pain?      Heartburn and acid reflux, has had it before   10. OTHER SYMPTOMS: Do you have any other symptoms? (e.g., dizziness, nausea, vomiting, sweating, fever, difficulty breathing, cough)       No    Pepto bismul did not provide relief, along with Tums. UC ruled out heart attack.  Protocols used: Chest Pain-A-AH

## 2023-07-14 ENCOUNTER — Encounter: Payer: Self-pay | Admitting: Internal Medicine

## 2023-07-14 ENCOUNTER — Ambulatory Visit: Admitting: Internal Medicine

## 2023-07-14 VITALS — BP 124/78 | HR 84 | Ht 60.0 in | Wt 180.0 lb

## 2023-07-14 DIAGNOSIS — K219 Gastro-esophageal reflux disease without esophagitis: Secondary | ICD-10-CM

## 2023-07-14 MED ORDER — PANTOPRAZOLE SODIUM 40 MG PO TBEC: 40.0000 mg | DELAYED_RELEASE_TABLET | Freq: Every day | ORAL | 0 refills | Status: DC

## 2023-07-14 NOTE — Assessment & Plan Note (Signed)
 Reflux symptoms are recurrent over the weekend but responded to Pantoprazole . Will continue a 2 week course then stop.  If symptoms recurrent, then obtain H Pylori before resume PPI Patient denies red flag symptoms - no melena, weight loss, dysphagia

## 2023-07-14 NOTE — Progress Notes (Signed)
 Date:  07/14/2023   Name:  Maria Gibson   DOB:  06/02/1940   MRN:  [id number]   Chief Complaint: Gastroesophageal Reflux (Had a bad episode of reflux on July 4th. Started at 830 PM. Son gave mother 40mg  of protonix , and tums 2 hours later. This helped. Wants Rx for protonix  40 mg. )  Gastroesophageal Reflux She complains of heartburn and nausea. She reports no abdominal pain, no chest pain or no wheezing. This is a new problem. The current episode started in the past 7 days. Episode frequency: one severe episode. The problem has been resolved. The symptoms are aggravated by caffeine. There are no known risk factors. She has tried a PPI (took a Protonix  for three days) for the symptoms. The treatment provided significant relief.    Review of Systems  Constitutional:  Negative for appetite change, chills, fever and unexpected weight change.  Respiratory:  Negative for chest tightness, shortness of breath and wheezing.   Cardiovascular:  Negative for chest pain and leg swelling.  Gastrointestinal:  Positive for heartburn and nausea. Negative for abdominal pain, constipation, diarrhea and vomiting.  Psychiatric/Behavioral:  Negative for dysphoric mood and sleep disturbance. The patient is not nervous/anxious.      Lab Results  Component Value Date   NA 140 04/08/2022   K 5.3 (H) 04/08/2022   CO2 27 04/08/2022   GLUCOSE 93 04/08/2022   BUN 27 04/08/2022   CREATININE 0.87 04/08/2022   CALCIUM 9.2 04/08/2022   EGFR 66 04/08/2022   GFRNONAA 49 (L) 03/09/2019   Lab Results  Component Value Date   CHOL 182 04/08/2022   HDL 94 04/08/2022   LDLCALC 78 04/08/2022   TRIG 52 04/08/2022   CHOLHDL 1.9 04/08/2022   Lab Results  Component Value Date   TSH 1.750 04/08/2022   Lab Results  Component Value Date   HGBA1C 5.4 03/15/2021   Lab Results  Component Value Date   WBC 7.1 04/08/2022   HGB 14.6 04/08/2022   HCT 44.2 04/08/2022   MCV 95 04/08/2022   PLT 206 04/08/2022    Lab Results  Component Value Date   ALT 18 04/08/2022   AST 17 04/08/2022   ALKPHOS 71 04/08/2022   BILITOT 0.4 04/08/2022   No results found for: MARIEN BOLLS, VD25OH   Patient Active Problem List   Diagnosis Date Noted   Parotid mass 07/30/2022   Artificial knee joint present 12/23/2018   Osteoarthritis of right shoulder 03/06/2017   Arthritis of right knee 04/15/2016   Blepharoptosis, acquired, left 04/15/2016   OA (osteoarthritis) of ankle 10/17/2015   Essential (primary) hypertension 12/01/2014   Gastroesophageal reflux disease 12/01/2014   Hypertriglyceridemia 12/01/2014   Degenerative arthritis of finger 12/01/2014   Urge incontinence of urine 12/01/2014   Sequelae of cerebral infarction 12/01/2014    Allergies  Allergen Reactions   Baclofen  Swelling    hands   Codeine     Iodine    Morphine Sulfate    Shellfish Allergy    Sulfa Antibiotics Nausea And Vomiting    Past Surgical History:  Procedure Laterality Date   INTRACAPSULAR CATARACT EXTRACTION Bilateral 2017   KNEE SURGERY     REVERSE TOTAL SHOULDER ARTHROPLASTY Right 07/2018   TOTAL SHOULDER REPLACEMENT Left    TUBAL LIGATION     VARICOSE VEIN SURGERY      Social History   Tobacco Use   Smoking status: Never   Smokeless tobacco: Never   Tobacco comments:  smoking cessation materials not required  Vaping Use   Vaping status: Never Used  Substance Use Topics   Alcohol use: Yes    Alcohol/week: 1.0 standard drink of alcohol    Types: 1 Glasses of wine per week    Comment: occasional   Drug use: No     Medication list has been reviewed and updated.  Current Meds  Medication Sig   acetaminophen (TYLENOL) 325 MG tablet Take 325 mg by mouth every 4 (four) hours as needed.   aspirin EC 81 MG tablet Take 1 tablet by mouth daily.   Calcium Carb-Cholecalciferol (CALCIUM + D3) 600-200 MG-UNIT TABS Take by mouth.   COLLAGEN PO Take by mouth. Collagen protein powder mixed with  coffee daily in AM   lisinopril  (ZESTRIL ) 10 MG tablet Take 1 tablet (10 mg total) by mouth daily.   Multiple Vitamins-Minerals (MULTIVITAMIN ADULTS 50+ PO) Take by mouth.   OVER THE COUNTER MEDICATION Lectin shield gundry supplement BID   OVER THE COUNTER MEDICATION Bio complete 3 probiotic gundry supply   pantoprazole  (PROTONIX ) 40 MG tablet Take 1 tablet (40 mg total) by mouth daily.       07/14/2023   10:54 AM 05/26/2023    3:26 PM 04/23/2022   10:19 AM 04/08/2022   10:29 AM  GAD 7 : Generalized Anxiety Score  Nervous, Anxious, on Edge 0 0 1 1  Control/stop worrying 0 0 0 1  Worry too much - different things 0 1 0 1  Trouble relaxing 0 1 0 0  Restless 0 0 0 0  Easily annoyed or irritable 0 0 0 0  Afraid - awful might happen 0 0 0 0  Total GAD 7 Score 0 2 1 3   Anxiety Difficulty Not difficult at all Not difficult at all Not difficult at all Not difficult at all       07/14/2023   10:54 AM 05/26/2023    3:26 PM 04/08/2023    9:29 AM  Depression screen PHQ 2/9  Decreased Interest 0 0 0  Down, Depressed, Hopeless 0 0 0  PHQ - 2 Score 0 0 0  Altered sleeping 1 2 1   Tired, decreased energy 1 2 1   Change in appetite 0 0 0  Feeling bad or failure about yourself  0 0 0  Trouble concentrating 0 0 0  Moving slowly or fidgety/restless 0 0 0  Suicidal thoughts 0 0 0  PHQ-9 Score 2 4 2   Difficult doing work/chores Not difficult at all Not difficult at all Not difficult at all    BP Readings from Last 3 Encounters:  07/14/23 124/78  05/26/23 138/82  04/08/23 136/82    Physical Exam Vitals and nursing note reviewed.  Constitutional:      General: She is not in acute distress.    Appearance: Normal appearance. She is well-developed.  HENT:     Head: Normocephalic and atraumatic.  Cardiovascular:     Rate and Rhythm: Normal rate and regular rhythm.  Pulmonary:     Effort: Pulmonary effort is normal. No respiratory distress.     Breath sounds: No wheezing or rhonchi.  Abdominal:      General: Bowel sounds are normal.     Palpations: Abdomen is soft.     Tenderness: There is no abdominal tenderness. There is no guarding or rebound.  Musculoskeletal:     Cervical back: Normal range of motion.  Skin:    General: Skin is warm and dry.  Findings: No rash.  Neurological:     Mental Status: She is alert and oriented to person, place, and time.  Psychiatric:        Mood and Affect: Mood normal.        Behavior: Behavior normal.     Wt Readings from Last 3 Encounters:  07/14/23 180 lb (81.6 kg)  05/26/23 184 lb (83.5 kg)  04/08/23 183 lb 3.2 oz (83.1 kg)    BP 124/78   Pulse 84   Ht 5' (1.524 m)   Wt 180 lb (81.6 kg)   SpO2 95%   BMI 35.15 kg/m   Assessment and Plan:  Problem List Items Addressed This Visit       Unprioritized   Gastroesophageal reflux disease - Primary   Reflux symptoms are recurrent over the weekend but responded to Pantoprazole . Will continue a 2 week course then stop.  If symptoms recurrent, then obtain H Pylori before resume PPI Patient denies red flag symptoms - no melena, weight loss, dysphagia      Relevant Medications   pantoprazole  (PROTONIX ) 40 MG tablet    No follow-ups on file.    Leita HILARIO Adie, MD [facility] [place]

## 2023-07-14 NOTE — Patient Instructions (Signed)
 Take pantoprazole  daily for 2 weeks then stop. If symptoms return, call right away to get the H Pylori breath test done.

## 2023-08-10 ENCOUNTER — Other Ambulatory Visit: Payer: Self-pay | Admitting: Internal Medicine

## 2023-08-10 DIAGNOSIS — K219 Gastro-esophageal reflux disease without esophagitis: Secondary | ICD-10-CM

## 2023-08-11 NOTE — Telephone Encounter (Signed)
 Requested medication (s) are due for refill today: yes  Requested medication (s) are on the active medication list: yes  Last refill:  07/14/23 #30  Future visit scheduled: yes  Notes to clinic:  per notes at last office visit: Reflux symptoms are recurrent over the weekend but responded to Pantoprazole . Will continue a 2 week course then stop.  If symptoms recurrent, then obtain H Pylori before resume PPI    Requested Prescriptions  Pending Prescriptions Disp Refills   pantoprazole  (PROTONIX ) 40 MG tablet [Pharmacy Med Name: PANTOPRAZOLE  40MG  TABLETS] 30 tablet 0    Sig: TAKE 1 TABLET(40 MG) BY MOUTH DAILY     Gastroenterology: Proton Pump Inhibitors Passed - 08/11/2023 11:17 AM      Passed - Valid encounter within last 12 months    Recent Outpatient Visits           4 weeks ago Gastroesophageal reflux disease, unspecified whether esophagitis present   [facility] at [facility], Leita DEL, MD   2 months ago Colon cancer screening   [facility] at [facility], Leita DEL, MD       Future Appointments             In 3 months Justus, Leita DEL, MD [facility] at [facility]

## 2023-10-12 DIAGNOSIS — Z86018 Personal history of other benign neoplasm: Secondary | ICD-10-CM | POA: Diagnosis not present

## 2023-10-12 DIAGNOSIS — L578 Other skin changes due to chronic exposure to nonionizing radiation: Secondary | ICD-10-CM | POA: Diagnosis not present

## 2023-10-30 DIAGNOSIS — H6123 Impacted cerumen, bilateral: Secondary | ICD-10-CM | POA: Diagnosis not present

## 2023-10-30 DIAGNOSIS — H903 Sensorineural hearing loss, bilateral: Secondary | ICD-10-CM | POA: Diagnosis not present

## 2023-11-26 ENCOUNTER — Encounter: Payer: Self-pay | Admitting: Internal Medicine

## 2023-11-26 ENCOUNTER — Ambulatory Visit (INDEPENDENT_AMBULATORY_CARE_PROVIDER_SITE_OTHER): Admitting: Internal Medicine

## 2023-11-26 VITALS — BP 118/72 | HR 78 | Ht 60.0 in | Wt 183.0 lb

## 2023-11-26 DIAGNOSIS — Z1231 Encounter for screening mammogram for malignant neoplasm of breast: Secondary | ICD-10-CM | POA: Diagnosis not present

## 2023-11-26 DIAGNOSIS — Z0000 Encounter for general adult medical examination without abnormal findings: Secondary | ICD-10-CM | POA: Diagnosis not present

## 2023-11-26 DIAGNOSIS — E781 Pure hyperglyceridemia: Secondary | ICD-10-CM | POA: Diagnosis not present

## 2023-11-26 DIAGNOSIS — I10 Essential (primary) hypertension: Secondary | ICD-10-CM | POA: Diagnosis not present

## 2023-11-26 DIAGNOSIS — K219 Gastro-esophageal reflux disease without esophagitis: Secondary | ICD-10-CM | POA: Diagnosis not present

## 2023-11-26 MED ORDER — LISINOPRIL 10 MG PO TABS: 10.0000 mg | ORAL_TABLET | Freq: Every day | ORAL | 1 refills | Status: AC

## 2023-11-26 MED ORDER — PANTOPRAZOLE SODIUM 40 MG PO TBEC: 40.0000 mg | DELAYED_RELEASE_TABLET | Freq: Every day | ORAL | 0 refills | Status: DC

## 2023-11-26 NOTE — Assessment & Plan Note (Signed)
Managed with diet alone 

## 2023-11-26 NOTE — Patient Instructions (Signed)
 Call [facility] to schedule your mammogram at [phone].

## 2023-11-26 NOTE — Assessment & Plan Note (Signed)
 Well controlled blood pressure today. Current regimen is lisinopril  10 mg. No medication side effects noted.

## 2023-11-26 NOTE — Progress Notes (Signed)
 Date:  11/26/2023   Name:  Maria Gibson   DOB:  05-02-1940   MRN:  [id number]   Chief Complaint: Annual Exam Maria Gibson is a 83 y.o. female who presents today for her Complete Annual Exam. She feels well. She reports exercising. She reports she is sleeping well. Breast complaints - none.  She stays active doing house work and quilting.  Health Maintenance  Topic Date Due   COVID-19 Vaccine (7 - 2025-26 season) 09/07/2023   Breast Cancer Screening  04/07/2024   Medicare Annual Wellness Visit  04/07/2024   Osteoporosis screening with Bone Density Scan  03/22/2025   DTaP/Tdap/Td vaccine (2 - Td or Tdap) 09/18/2030   Pneumococcal Vaccine for age over 39  Completed   Flu Shot  Completed   Zoster (Shingles) Vaccine  Completed   Meningitis B Vaccine  Aged Out    Hypertension This is a chronic problem. The problem is controlled. Pertinent negatives include no chest pain, headaches, palpitations or shortness of breath. Past treatments include ACE inhibitors. The current treatment provides significant improvement. There is no history of kidney disease, CAD/MI or CVA.  Gastroesophageal Reflux She complains of heartburn. She reports no abdominal pain, no chest pain, no coughing or no wheezing. This is a recurrent problem. The problem occurs occasionally. Pertinent negatives include no fatigue.    Review of Systems  Constitutional:  Negative for fatigue and unexpected weight change.  HENT:  Negative for trouble swallowing.   Eyes:  Negative for visual disturbance.  Respiratory:  Negative for cough, chest tightness, shortness of breath and wheezing.   Cardiovascular:  Negative for chest pain, palpitations and leg swelling.  Gastrointestinal:  Positive for heartburn. Negative for abdominal pain, constipation and diarrhea.  Musculoskeletal:  Negative for arthralgias and myalgias.  Neurological:  Negative for dizziness, weakness, light-headedness and headaches.     Lab  Results  Component Value Date   NA 140 04/08/2022   K 5.3 (H) 04/08/2022   CO2 27 04/08/2022   GLUCOSE 93 04/08/2022   BUN 27 04/08/2022   CREATININE 0.87 04/08/2022   CALCIUM 9.2 04/08/2022   EGFR 66 04/08/2022   GFRNONAA 49 (L) 03/09/2019   Lab Results  Component Value Date   CHOL 182 04/08/2022   HDL 94 04/08/2022   LDLCALC 78 04/08/2022   TRIG 52 04/08/2022   CHOLHDL 1.9 04/08/2022   Lab Results  Component Value Date   TSH 1.750 04/08/2022   Lab Results  Component Value Date   HGBA1C 5.4 03/15/2021   Lab Results  Component Value Date   WBC 7.1 04/08/2022   HGB 14.6 04/08/2022   HCT 44.2 04/08/2022   MCV 95 04/08/2022   PLT 206 04/08/2022   Lab Results  Component Value Date   ALT 18 04/08/2022   AST 17 04/08/2022   ALKPHOS 71 04/08/2022   BILITOT 0.4 04/08/2022   No results found for: MARIEN BOLLS, VD25OH   Patient Active Problem List   Diagnosis Date Noted   Parotid mass 07/30/2022   Artificial knee joint present 12/23/2018   Obesity, morbid (HCC) 03/08/2018   Osteoarthritis of right shoulder 03/06/2017   Arthritis of right knee 04/15/2016   Blepharoptosis, acquired, left 04/15/2016   OA (osteoarthritis) of ankle 10/17/2015   Essential (primary) hypertension 12/01/2014   Gastroesophageal reflux disease 12/01/2014   Hypertriglyceridemia 12/01/2014   Degenerative arthritis of finger 12/01/2014   Urge incontinence of urine 12/01/2014   Sequelae of cerebral infarction 12/01/2014  Allergies  Allergen Reactions   Baclofen  Swelling    hands   Codeine     Iodine    Morphine Sulfate    Shellfish Allergy    Sulfa Antibiotics Nausea And Vomiting    Past Surgical History:  Procedure Laterality Date   INTRACAPSULAR CATARACT EXTRACTION Bilateral 2017   KNEE SURGERY     REVERSE TOTAL SHOULDER ARTHROPLASTY Right 07/2018   TOTAL SHOULDER REPLACEMENT Left    TUBAL LIGATION     VARICOSE VEIN SURGERY      Social History   Tobacco Use    Smoking status: Never   Smokeless tobacco: Never   Tobacco comments:    smoking cessation materials not required  Vaping Use   Vaping status: Never Used  Substance Use Topics   Alcohol use: Yes    Alcohol/week: 1.0 standard drink of alcohol    Types: 1 Glasses of wine per week    Comment: occasional   Drug use: No     Medication list has been reviewed and updated.  Current Meds  Medication Sig   acetaminophen (TYLENOL) 325 MG tablet Take 325 mg by mouth every 4 (four) hours as needed.   aspirin EC 81 MG tablet Take 1 tablet by mouth daily.   Calcium Carb-Cholecalciferol (CALCIUM + D3) 600-200 MG-UNIT TABS Take by mouth.   COLLAGEN PO Take by mouth. Collagen protein powder mixed with coffee daily in AM   lisinopril  (ZESTRIL ) 10 MG tablet Take 1 tablet (10 mg total) by mouth daily.   Multiple Vitamins-Minerals (MULTIVITAMIN ADULTS 50+ PO) Take by mouth.   OVER THE COUNTER MEDICATION Lectin shield gundry supplement BID   OVER THE COUNTER MEDICATION Bio complete 3 probiotic gundry supply   pantoprazole  (PROTONIX ) 40 MG tablet Take 1 tablet (40 mg total) by mouth daily.       07/14/2023   10:54 AM 05/26/2023    3:26 PM 04/23/2022   10:19 AM 04/08/2022   10:29 AM  GAD 7 : Generalized Anxiety Score  Nervous, Anxious, on Edge 0 0 1 1  Control/stop worrying 0 0 0 1  Worry too much - different things 0 1 0 1  Trouble relaxing 0 1 0 0  Restless 0 0 0 0  Easily annoyed or irritable 0 0 0 0  Afraid - awful might happen 0 0 0 0  Total GAD 7 Score 0 2 1 3   Anxiety Difficulty Not difficult at all Not difficult at all Not difficult at all Not difficult at all       07/14/2023   10:54 AM 05/26/2023    3:26 PM 04/08/2023    9:29 AM  Depression screen PHQ 2/9  Decreased Interest 0 0 0  Down, Depressed, Hopeless 0 0 0  PHQ - 2 Score 0 0 0  Altered sleeping 1 2 1   Tired, decreased energy 1 2 1   Change in appetite 0 0 0  Feeling bad or failure about yourself  0 0 0  Trouble  concentrating 0 0 0  Moving slowly or fidgety/restless 0 0 0  Suicidal thoughts 0 0 0  PHQ-9 Score 2  4  2    Difficult doing work/chores Not difficult at all Not difficult at all Not difficult at all     Data saved with a previous flowsheet row definition    BP Readings from Last 3 Encounters:  11/26/23 118/72  07/14/23 124/78  05/26/23 138/82    Physical Exam Vitals and nursing note reviewed.  Constitutional:  General: She is not in acute distress.    Appearance: She is well-developed.  HENT:     Head: Normocephalic and atraumatic.     Right Ear: Tympanic membrane and ear canal normal.     Left Ear: Tympanic membrane and ear canal normal.     Nose:     Right Sinus: No maxillary sinus tenderness.     Left Sinus: No maxillary sinus tenderness.  Eyes:     General: No scleral icterus.       Right eye: No discharge.        Left eye: No discharge.     Conjunctiva/sclera: Conjunctivae normal.  Neck:     Thyroid: No thyromegaly.     Vascular: No carotid bruit.  Cardiovascular:     Rate and Rhythm: Normal rate and regular rhythm.     Pulses: Normal pulses.     Heart sounds: Normal heart sounds.  Pulmonary:     Effort: Pulmonary effort is normal. No respiratory distress.     Breath sounds: No wheezing.  Abdominal:     General: Bowel sounds are normal.     Palpations: Abdomen is soft.     Tenderness: There is no abdominal tenderness.  Musculoskeletal:     Cervical back: Normal range of motion. No erythema.     Right lower leg: No edema.     Left lower leg: No edema.  Lymphadenopathy:     Cervical: No cervical adenopathy.  Skin:    General: Skin is warm and dry.     Findings: No rash.  Neurological:     Mental Status: She is alert and oriented to person, place, and time.     Cranial Nerves: No cranial nerve deficit.     Sensory: No sensory deficit.     Deep Tendon Reflexes: Reflexes are normal and symmetric.  Psychiatric:        Attention and Perception:  Attention normal.        Mood and Affect: Mood normal.     Wt Readings from Last 3 Encounters:  11/26/23 183 lb (83 kg)  07/14/23 180 lb (81.6 kg)  05/26/23 184 lb (83.5 kg)    BP 118/72   Pulse 78   Ht 5' (1.524 m)   Wt 183 lb (83 kg)   SpO2 97%   BMI 35.74 kg/m   Assessment and Plan:  Problem List Items Addressed This Visit       Unprioritized   Essential (primary) hypertension (Chronic)   Well controlled blood pressure today. Current regimen is lisinopril  10 mg. No medication side effects noted.        Relevant Medications   lisinopril  (ZESTRIL ) 10 MG tablet   Other Relevant Orders   CBC with Differential/Platelet   Comprehensive metabolic panel with GFR   TSH   Urinalysis, Routine w reflex microscopic   Gastroesophageal reflux disease   Currently taking PPI prn with minimal reflux symptoms. Patient denies red flag symptoms - no melena, weight loss, dysphagia. Will maintain current management.       Relevant Medications   pantoprazole  (PROTONIX ) 40 MG tablet   Hypertriglyceridemia   Managed with diet alone.      Relevant Medications   lisinopril  (ZESTRIL ) 10 MG tablet   Other Relevant Orders   Lipid panel   Obesity, morbid (HCC)   Other Visit Diagnoses       Annual physical exam    -  Primary   up to date on all screenings and  immunizations     Encounter for screening mammogram for breast cancer       Relevant Orders   MM 3D SCREENING MAMMOGRAM BILATERAL BREAST       Return in about 6 months (around 05/25/2024) for HTN  Dr. Sol.    Leita HILARIO Adie, MD [facility] [place]

## 2023-11-26 NOTE — Assessment & Plan Note (Signed)
 Currently taking PPI prn with minimal reflux symptoms. Patient denies red flag symptoms - no melena, weight loss, dysphagia. Will maintain current management.

## 2023-11-30 DIAGNOSIS — E781 Pure hyperglyceridemia: Secondary | ICD-10-CM | POA: Diagnosis not present

## 2023-12-01 ENCOUNTER — Ambulatory Visit: Payer: Self-pay | Admitting: Internal Medicine

## 2023-12-01 LAB — CBC WITH DIFFERENTIAL/PLATELET
Basophils Absolute: 0 x10E3/uL (ref 0.0–0.2)
Basos: 0 %
EOS (ABSOLUTE): 0 x10E3/uL (ref 0.0–0.4)
Eos: 1 %
Hematocrit: 49.7 % — ABNORMAL HIGH (ref 34.0–46.6)
Hemoglobin: 16.3 g/dL — ABNORMAL HIGH (ref 11.1–15.9)
Immature Grans (Abs): 0 x10E3/uL (ref 0.0–0.1)
Immature Granulocytes: 0 %
Lymphocytes Absolute: 1.5 x10E3/uL (ref 0.7–3.1)
Lymphs: 29 %
MCH: 32.7 pg (ref 26.6–33.0)
MCHC: 32.8 g/dL (ref 31.5–35.7)
MCV: 100 fL — ABNORMAL HIGH (ref 79–97)
Monocytes Absolute: 0.5 x10E3/uL (ref 0.1–0.9)
Monocytes: 10 %
Neutrophils Absolute: 3 x10E3/uL (ref 1.4–7.0)
Neutrophils: 60 %
Platelets: 212 x10E3/uL (ref 150–450)
RBC: 4.98 x10E6/uL (ref 3.77–5.28)
RDW: 11.8 % (ref 11.7–15.4)
WBC: 5 x10E3/uL (ref 3.4–10.8)

## 2023-12-01 LAB — COMPREHENSIVE METABOLIC PANEL WITH GFR
ALT: 19 IU/L (ref 0–32)
AST: 22 IU/L (ref 0–40)
Albumin: 4.3 g/dL (ref 3.7–4.7)
Alkaline Phosphatase: 77 IU/L (ref 48–129)
BUN/Creatinine Ratio: 27 (ref 12–28)
BUN: 25 mg/dL (ref 8–27)
Bilirubin Total: 0.4 mg/dL (ref 0.0–1.2)
CO2: 23 mmol/L (ref 20–29)
Calcium: 9.6 mg/dL (ref 8.7–10.3)
Chloride: 104 mmol/L (ref 96–106)
Creatinine, Ser: 0.92 mg/dL (ref 0.57–1.00)
Globulin, Total: 2.7 g/dL (ref 1.5–4.5)
Glucose: 111 mg/dL — ABNORMAL HIGH (ref 70–99)
Potassium: 4.3 mmol/L (ref 3.5–5.2)
Sodium: 144 mmol/L (ref 134–144)
Total Protein: 7 g/dL (ref 6.0–8.5)
eGFR: 62 mL/min/1.73 (ref >59)

## 2023-12-01 LAB — URINALYSIS, ROUTINE W REFLEX MICROSCOPIC
Glucose, UA: NEGATIVE
Nitrite, UA: NEGATIVE
RBC, UA: NEGATIVE
Specific Gravity, UA: >=1.03 — AB (ref 1.005–1.030)
Urobilinogen, Ur: 1 mg/dL (ref 0.2–1.0)
pH, UA: 5.5 (ref 5.0–7.5)

## 2023-12-01 LAB — MICROSCOPIC EXAMINATION
Bacteria, UA: NONE SEEN
Casts: NONE SEEN /LPF
RBC, Urine: NONE SEEN /HPF (ref 0–2)

## 2023-12-01 LAB — LIPID PANEL
Chol/HDL Ratio: 2.4 ratio (ref 0.0–4.4)
Cholesterol, Total: 202 mg/dL — ABNORMAL HIGH (ref 100–199)
HDL: 83 mg/dL (ref >39)
LDL Chol Calc (NIH): 91 mg/dL (ref 0–99)
Triglycerides: 165 mg/dL — ABNORMAL HIGH (ref 0–149)
VLDL Cholesterol Cal: 28 mg/dL (ref 5–40)

## 2023-12-01 LAB — TSH: TSH: 1.26 u[IU]/mL (ref 0.450–4.500)

## 2023-12-22 ENCOUNTER — Other Ambulatory Visit: Payer: Self-pay | Admitting: Internal Medicine

## 2023-12-22 DIAGNOSIS — I10 Essential (primary) hypertension: Secondary | ICD-10-CM

## 2023-12-24 NOTE — Telephone Encounter (Signed)
 [facility] called and spoke to [place], Pharmacologist about the refill(s) lisinopril  requested. Advised it was sent on 11/26/23 #90/1 refill(s). She says it's on file and they can get it ready for pickup on Monday unless patient needs it sooner. Advised I'm not sure since this was a pharmacy generated request. She says she will cancel this request.  Will refuse this request due to this.   Requested Prescriptions  Pending Prescriptions Disp Refills   lisinopril  (ZESTRIL ) 10 MG tablet [Pharmacy Med Name: LISINOPRIL  10MG  TABLETS] 90 tablet 1    Sig: TAKE 1 TABLET(10 MG) BY MOUTH DAILY     Cardiovascular:  ACE Inhibitors Passed - 12/24/2023  6:17 PM      Passed - Cr in normal range and within 180 days    Creatinine  Date Value Ref Range Status  11/20/2011 1.01 0.60 - 1.30 mg/dL Final   Creatinine, Ser  Date Value Ref Range Status  11/30/2023 0.92 0.57 - 1.00 mg/dL Final         Passed - K in normal range and within 180 days    Potassium  Date Value Ref Range Status  11/30/2023 4.3 3.5 - 5.2 mmol/L Final  11/20/2011 3.9 3.5 - 5.1 mmol/L Final         Passed - Patient is not pregnant      Passed - Last BP in normal range    BP Readings from Last 1 Encounters:  11/26/23 118/72         Passed - Valid encounter within last 6 months    Recent Outpatient Visits           4 weeks ago Annual physical exam   [place] [facility] at [facility], Leita DEL, MD   5 months ago Gastroesophageal reflux disease, unspecified whether esophagitis present   [facility] at [facility], Leita DEL, MD   7 months ago Colon cancer screening   [facility] at [facility], Leita DEL, MD

## 2023-12-28 ENCOUNTER — Other Ambulatory Visit: Payer: Self-pay | Admitting: Internal Medicine

## 2023-12-28 ENCOUNTER — Ambulatory Visit: Payer: Self-pay

## 2023-12-28 DIAGNOSIS — K219 Gastro-esophageal reflux disease without esophagitis: Secondary | ICD-10-CM

## 2023-12-28 NOTE — Telephone Encounter (Signed)
 Noted  Pt has appt.  KP

## 2023-12-28 NOTE — Telephone Encounter (Signed)
" °  FYI Only or Action Required?: FYI only for provider: appointment scheduled on 12.23.25.  Patient was last seen in primary care on 11/26/2023 by Justus Leita DEL, MD.  Called Nurse Triage reporting Sinusitis.  Symptoms began several weeks ago.  Interventions attempted: Prescription medications: Amoxicillin  500 mg 1 Tab.  Symptoms are: gradually worsening.  Triage Disposition: See PCP When Office is Open (Within 3 Days)  Patient/caregiver understands and will follow disposition?: Yes  Copied from CRM #[id number]. Topic: Clinical - Red Word Triage >> Dec 28, 2023 10:16 AM Maria Gibson wrote: Red Word that prompted transfer to Nurse Triage: One side of nose/face feeling like an infection, gums are aching and nose is tender. When pt is laying on the side of nose/face that aches she begins to feel dizzy. Pt is requesting amoxicillin . Transferred to NT Reason for Disposition  [1] Sinus congestion (pressure, fullness) AND [2] present > 10 days  Answer Assessment - Initial Assessment Questions 1. LOCATION: Where does it hurt?       Right side of face  2. ONSET: When did the sinus pain start?  (e.g., hours, days)      2 weeks  3. SEVERITY: How bad is the pain?   (Scale 0-10; or none, mild, moderate or severe)     8/10  4. RECURRENT SYMPTOM: Have you ever had sinus problems before? If Yes, ask: When was the last time? and What happened that time?       Pt states symptoms are recurrent  5. NASAL CONGESTION: Is the nose blocked? If Yes, ask: Can you open it or must you breathe through your mouth?      Yes, nasal congestion-pain  6. NASAL DISCHARGE: Do you have discharge from your nose? If so ask, What color?      White  7. FEVER: Do you have a fever? If Yes, ask: What is it, how was it measured, and when did it start?       Denies  8. OTHER SYMPTOMS:Pt denies sore throat, cough, earache, difficulty breathing Pt c/o dizziness and gum irritation on right side       Pt reports sinus infection, facial pain, and dizziness Pt has taken one Amoxicillin  500 mg from old script Pt scheduled for a visit on 12.23.25  for further evaluation with PCP. Pt agrees with plan of care, will call back for any worsening symptoms  Protocols used: Sinus Pain or Congestion-A-AH  "

## 2023-12-29 ENCOUNTER — Ambulatory Visit: Admitting: Internal Medicine

## 2023-12-29 ENCOUNTER — Encounter: Payer: Self-pay | Admitting: Internal Medicine

## 2023-12-29 VITALS — BP 118/72 | HR 88 | Temp 97.6°F | Ht 60.0 in | Wt 183.0 lb

## 2023-12-29 DIAGNOSIS — J0100 Acute maxillary sinusitis, unspecified: Secondary | ICD-10-CM | POA: Diagnosis not present

## 2023-12-29 MED ORDER — AMOXICILLIN-POT CLAVULANATE 875-125 MG PO TABS: 1.0000 | ORAL_TABLET | Freq: Two times a day (BID) | ORAL | 0 refills | Status: AC

## 2023-12-29 NOTE — Progress Notes (Signed)
 "   Date:  12/29/2023   Name:  Maria Gibson   DOB:  1940-12-15   MRN:  [id number]   Chief Complaint: Sinus Infection (Pt stated--right side of the face, gum--painful with pressure, feels like head congested. Taking Amoxicillin  1 tab prescribe by dentist and tylenol. Denied fever.)  Sinus Problem This is a new problem. The current episode started in the past 7 days. The problem is unchanged. There has been no fever. Associated symptoms include congestion, headaches, sinus pressure and a sore throat. Pertinent negatives include no chills, ear pain or shortness of breath.    Review of Systems  Constitutional:  Negative for chills, fatigue and fever.  HENT:  Positive for congestion, sinus pressure and sore throat. Negative for ear pain and postnasal drip.   Respiratory:  Negative for chest tightness and shortness of breath.   Cardiovascular:  Negative for chest pain.  Neurological:  Positive for headaches.  Psychiatric/Behavioral:  Negative for dysphoric mood and sleep disturbance. The patient is not nervous/anxious.      Lab Results  Component Value Date   NA 144 11/30/2023   K 4.3 11/30/2023   CO2 23 11/30/2023   GLUCOSE 111 (H) 11/30/2023   BUN 25 11/30/2023   CREATININE 0.92 11/30/2023   CALCIUM 9.6 11/30/2023   EGFR 62 11/30/2023   GFRNONAA 49 (L) 03/09/2019   Lab Results  Component Value Date   CHOL 202 (H) 11/30/2023   HDL 83 11/30/2023   LDLCALC 91 11/30/2023   TRIG 165 (H) 11/30/2023   CHOLHDL 2.4 11/30/2023   Lab Results  Component Value Date   TSH 1.260 11/30/2023   Lab Results  Component Value Date   HGBA1C 5.4 03/15/2021   Lab Results  Component Value Date   WBC 5.0 11/30/2023   HGB 16.3 (H) 11/30/2023   HCT 49.7 (H) 11/30/2023   MCV 100 (H) 11/30/2023   PLT 212 11/30/2023   Lab Results  Component Value Date   ALT 19 11/30/2023   AST 22 11/30/2023   ALKPHOS 77 11/30/2023   BILITOT 0.4 11/30/2023   No results found for: MARIEN BOLLS, VD25OH   Patient Active Problem List   Diagnosis Date Noted   Parotid mass 07/30/2022   Artificial knee joint present 12/23/2018   Obesity, morbid (HCC) 03/08/2018   Osteoarthritis of right shoulder 03/06/2017   Arthritis of right knee 04/15/2016   Blepharoptosis, acquired, left 04/15/2016   OA (osteoarthritis) of ankle 10/17/2015   Essential (primary) hypertension 12/01/2014   Gastroesophageal reflux disease 12/01/2014   Hypertriglyceridemia 12/01/2014   Degenerative arthritis of finger 12/01/2014   Urge incontinence of urine 12/01/2014   Sequelae of cerebral infarction 12/01/2014    Allergies[1]  Past Surgical History:  Procedure Laterality Date   INTRACAPSULAR CATARACT EXTRACTION Bilateral 2017   KNEE SURGERY     REVERSE TOTAL SHOULDER ARTHROPLASTY Right 07/2018   TOTAL SHOULDER REPLACEMENT Left    TUBAL LIGATION     VARICOSE VEIN SURGERY      Social History[2]   Medication list has been reviewed and updated.  Active Medications[3]     12/29/2023   11:35 AM 07/14/2023   10:54 AM 05/26/2023    3:26 PM 04/23/2022   10:19 AM  GAD 7 : Generalized Anxiety Score  Nervous, Anxious, on Edge 0 0 0 1  Control/stop worrying 0 0 0 0  Worry too much - different things 1 0 1 0  Trouble relaxing 0 0 1 0  Restless 0  0 0 0  Easily annoyed or irritable 0 0 0 0  Afraid - awful might happen 0 0 0 0  Total GAD 7 Score 1 0 2 1  Anxiety Difficulty Not difficult at all Not difficult at all Not difficult at all Not difficult at all       12/29/2023   11:35 AM 07/14/2023   10:54 AM 05/26/2023    3:26 PM  Depression screen PHQ 2/9  Decreased Interest 0 0 0  Down, Depressed, Hopeless 0 0 0  PHQ - 2 Score 0 0 0  Altered sleeping 1 1 2   Tired, decreased energy 1 1 2   Change in appetite 0 0 0  Feeling bad or failure about yourself  0 0 0  Trouble concentrating 0 0 0  Moving slowly or fidgety/restless 0 0 0  Suicidal thoughts 0 0 0  PHQ-9 Score 2 2  4    Difficult  doing work/chores Not difficult at all Not difficult at all Not difficult at all     Data saved with a previous flowsheet row definition    BP Readings from Last 3 Encounters:  12/29/23 118/72  11/26/23 118/72  07/14/23 124/78    Physical Exam Vitals and nursing note reviewed.  Constitutional:      General: She is not in acute distress.    Appearance: Normal appearance. She is well-developed.  HENT:     Head: Normocephalic and atraumatic.     Nose:     Right Sinus: Maxillary sinus tenderness present. No frontal sinus tenderness.     Left Sinus: No maxillary sinus tenderness or frontal sinus tenderness.     Mouth/Throat:     Pharynx: Oropharynx is clear.  Cardiovascular:     Rate and Rhythm: Normal rate and regular rhythm.  Pulmonary:     Effort: Pulmonary effort is normal. No respiratory distress.     Breath sounds: No wheezing or rhonchi.  Musculoskeletal:     Cervical back: Normal range of motion.  Lymphadenopathy:     Cervical: No cervical adenopathy.  Skin:    General: Skin is warm and dry.     Findings: No rash.  Neurological:     Mental Status: She is alert and oriented to person, place, and time.  Psychiatric:        Mood and Affect: Mood normal.        Behavior: Behavior normal.     Wt Readings from Last 3 Encounters:  12/29/23 183 lb (83 kg)  11/26/23 183 lb (83 kg)  07/14/23 180 lb (81.6 kg)    BP 118/72   Pulse 88   Temp 97.6 F (36.4 C)   Ht 5' (1.524 m)   Wt 183 lb (83 kg)   SpO2 97%   BMI 35.74 kg/m   Assessment and Plan:  Problem List Items Addressed This Visit   None Visit Diagnoses       Acute non-recurrent maxillary sinusitis    -  Primary   continue fluids and Tylenol for discomfort Augmentin  bid x 10 days   Relevant Medications   amoxicillin -clavulanate (AUGMENTIN ) 875-125 MG tablet       No follow-ups on file.    Leita HILARIO Adie, MD [facility] [place]           [1]   Allergies Allergen Reactions   Baclofen  Swelling    hands   Codeine     Iodine    Morphine Sulfate  Shellfish Allergy    Sulfa Antibiotics Nausea And Vomiting  [2]  Social History Tobacco Use   Smoking status: Never   Smokeless tobacco: Never   Tobacco comments:    smoking cessation materials not required  Vaping Use   Vaping status: Never Used  Substance Use Topics   Alcohol use: Yes    Alcohol/week: 1.0 standard drink of alcohol    Types: 1 Glasses of wine per week    Comment: occasional   Drug use: No  [3]  Current Meds  Medication Sig   amoxicillin -clavulanate (AUGMENTIN ) 875-125 MG tablet Take 1 tablet by mouth 2 (two) times daily for 10 days.   "

## 2023-12-30 NOTE — Telephone Encounter (Signed)
 Requested Prescriptions  Pending Prescriptions Disp Refills   pantoprazole  (PROTONIX ) 40 MG tablet [Pharmacy Med Name: PANTOPRAZOLE  40MG  TABLETS] 30 tablet 0    Sig: TAKE 1 TABLET(40 MG) BY MOUTH DAILY     Gastroenterology: Proton Pump Inhibitors Passed - 12/30/2023 11:20 AM      Passed - Valid encounter within last 12 months    Recent Outpatient Visits           Yesterday Acute non-recurrent maxillary sinusitis   [place] [facility] at [facility], Leita DEL, MD   1 month ago Annual physical exam   [facility] at [facility], Leita DEL, MD   5 months ago Gastroesophageal reflux disease, unspecified whether esophagitis present   [facility] at [facility], Leita DEL, MD   7 months ago Colon cancer screening   [facility] at [facility], Leita DEL, MD

## 2024-04-13 ENCOUNTER — Ambulatory Visit

## 2024-05-25 ENCOUNTER — Ambulatory Visit

## 2024-05-27 ENCOUNTER — Ambulatory Visit: Admitting: Family Medicine
